# Patient Record
Sex: Male | Born: 1955
Health system: Southern US, Community
[De-identification: ages and names within clinical notes are randomized; demographics above are authoritative.]

## PROBLEM LIST (undated history)

## (undated) DIAGNOSIS — G43909 Migraine, unspecified, not intractable, without status migrainosus: Secondary | ICD-10-CM

## (undated) DIAGNOSIS — H539 Unspecified visual disturbance: Secondary | ICD-10-CM

## (undated) DIAGNOSIS — G35 Multiple sclerosis: Secondary | ICD-10-CM

## (undated) DIAGNOSIS — I10 Essential (primary) hypertension: Secondary | ICD-10-CM

## (undated) DIAGNOSIS — N200 Calculus of kidney: Secondary | ICD-10-CM

## (undated) HISTORY — PX: BACK SURGERY: SHX140

## (undated) HISTORY — DX: Unspecified visual disturbance: H53.9

## (undated) HISTORY — PX: FOOT SURGERY: SHX648

## (undated) HISTORY — DX: Calculus of kidney: N20.0

---

## 1999-01-17 ENCOUNTER — Encounter: Payer: Self-pay | Admitting: Specialist

## 1999-01-17 ENCOUNTER — Ambulatory Visit (HOSPITAL_COMMUNITY): Admission: RE | Admit: 1999-01-17 | Discharge: 1999-01-17 | Payer: Self-pay | Admitting: Specialist

## 1999-04-20 ENCOUNTER — Ambulatory Visit (HOSPITAL_COMMUNITY): Admission: RE | Admit: 1999-04-20 | Discharge: 1999-04-20 | Payer: Self-pay | Admitting: Neurology

## 1999-04-26 ENCOUNTER — Ambulatory Visit: Admission: RE | Admit: 1999-04-26 | Discharge: 1999-04-26 | Payer: Self-pay | Admitting: Anesthesiology

## 2000-04-11 ENCOUNTER — Ambulatory Visit (HOSPITAL_COMMUNITY): Admission: RE | Admit: 2000-04-11 | Discharge: 2000-04-11 | Payer: Self-pay | Admitting: Neurology

## 2000-04-11 ENCOUNTER — Encounter: Payer: Self-pay | Admitting: Neurology

## 2000-05-27 ENCOUNTER — Encounter: Payer: Self-pay | Admitting: Neurology

## 2000-05-27 ENCOUNTER — Ambulatory Visit (HOSPITAL_COMMUNITY): Admission: RE | Admit: 2000-05-27 | Discharge: 2000-05-27 | Payer: Self-pay | Admitting: Neurology

## 2000-08-21 ENCOUNTER — Encounter: Payer: Self-pay | Admitting: Psychiatry

## 2000-08-21 ENCOUNTER — Ambulatory Visit (HOSPITAL_COMMUNITY): Admission: RE | Admit: 2000-08-21 | Discharge: 2000-08-21 | Payer: Self-pay | Admitting: Psychiatry

## 2001-01-02 ENCOUNTER — Encounter: Payer: Self-pay | Admitting: *Deleted

## 2001-01-02 ENCOUNTER — Encounter: Admission: RE | Admit: 2001-01-02 | Discharge: 2001-01-02 | Payer: Self-pay | Admitting: *Deleted

## 2001-01-09 ENCOUNTER — Ambulatory Visit (HOSPITAL_COMMUNITY): Admission: RE | Admit: 2001-01-09 | Discharge: 2001-01-09 | Payer: Self-pay | Admitting: *Deleted

## 2002-03-03 ENCOUNTER — Encounter: Payer: Self-pay | Admitting: *Deleted

## 2002-03-03 ENCOUNTER — Ambulatory Visit (HOSPITAL_COMMUNITY): Admission: RE | Admit: 2002-03-03 | Discharge: 2002-03-03 | Payer: Self-pay | Admitting: Nephrology

## 2003-03-02 ENCOUNTER — Encounter: Payer: Self-pay | Admitting: Specialist

## 2003-03-03 ENCOUNTER — Ambulatory Visit (HOSPITAL_COMMUNITY): Admission: RE | Admit: 2003-03-03 | Discharge: 2003-03-04 | Payer: Self-pay | Admitting: Specialist

## 2003-03-03 ENCOUNTER — Encounter: Payer: Self-pay | Admitting: Specialist

## 2004-01-28 ENCOUNTER — Ambulatory Visit (HOSPITAL_COMMUNITY): Admission: RE | Admit: 2004-01-28 | Discharge: 2004-01-28 | Payer: Self-pay | Admitting: Psychiatry

## 2004-02-01 ENCOUNTER — Ambulatory Visit (HOSPITAL_COMMUNITY): Admission: RE | Admit: 2004-02-01 | Discharge: 2004-02-01 | Payer: Self-pay | Admitting: Psychiatry

## 2005-01-01 ENCOUNTER — Ambulatory Visit: Payer: Self-pay | Admitting: Internal Medicine

## 2005-01-09 ENCOUNTER — Encounter (HOSPITAL_COMMUNITY): Admission: RE | Admit: 2005-01-09 | Discharge: 2005-02-08 | Payer: Self-pay | Admitting: Specialist

## 2005-01-17 ENCOUNTER — Ambulatory Visit (HOSPITAL_COMMUNITY): Admission: RE | Admit: 2005-01-17 | Discharge: 2005-01-17 | Payer: Self-pay | Admitting: Internal Medicine

## 2005-01-17 ENCOUNTER — Ambulatory Visit: Payer: Self-pay | Admitting: Internal Medicine

## 2005-01-19 ENCOUNTER — Ambulatory Visit (HOSPITAL_COMMUNITY): Admission: RE | Admit: 2005-01-19 | Discharge: 2005-01-19 | Payer: Self-pay | Admitting: Internal Medicine

## 2009-12-21 ENCOUNTER — Emergency Department (HOSPITAL_COMMUNITY): Admission: EM | Admit: 2009-12-21 | Discharge: 2009-12-21 | Payer: Self-pay | Admitting: Emergency Medicine

## 2010-07-14 ENCOUNTER — Emergency Department (HOSPITAL_COMMUNITY): Admission: EM | Admit: 2010-07-14 | Discharge: 2010-07-14 | Payer: Self-pay | Admitting: Emergency Medicine

## 2010-09-05 ENCOUNTER — Encounter
Admission: RE | Admit: 2010-09-05 | Discharge: 2010-09-05 | Payer: Self-pay | Source: Home / Self Care | Attending: Psychiatry | Admitting: Psychiatry

## 2010-09-10 ENCOUNTER — Encounter: Payer: Self-pay | Admitting: Family Medicine

## 2010-11-07 LAB — ROCKY MTN SPOTTED FVR AB, IGM-BLOOD: RMSF IgM: 0.1 IV (ref 0.00–0.89)

## 2011-01-05 NOTE — Op Note (Signed)
NAME:  Paul Pope, Paul Pope                 ACCOUNT NO.:  [id number]   MEDICAL RECORD NO.:  [id number]          PATIENT TYPE:  AMB   LOCATION:  DAY                           FACILITY:  [facility]   PHYSICIAN:  Lionel December, M.D.    DATE OF BIRTH:  [birth year]   DATE OF PROCEDURE:  01/17/2005  DATE OF DISCHARGE:                                 OPERATIVE REPORT   PROCEDURE:  Esophagogastroduodenoscopy with esophageal dilation.   INDICATIONS:  Boris is a 55 year old Caucasian male with chronic GERD as  well as intermittent solid food dysphagia.  He has had heartburn for at  least five years.  He is presently using Nexium on a p.r.n. basis.  He is  undergoing diagnostic and possibly therapeutic EGD.  Procedures were  reviewed with the patient and informed consent was obtained.   PREMEDICATION:  Cetacaine spray for pharyngeal topical anesthesia, Demerol  50 mg IV, Versed 8 mg IV in divided doses.   FINDINGS:  The procedures were performed in the endoscopy suite.  The  patient's vital signs and O2 saturation were monitored during the procedure  and remained stable.   The patient was placed in the left lateral recumbent position and Olympus  videoscope was passed through the oropharynx without any difficulty into the  esophagus.   Esophagus:  Mucosa of the esophagus was normal throughout.  The GE junction  was at 40 cm from the incisors.   Stomach:  It was empty and distended very well on insufflation.  Folds in  the proximal stomach were normal.  Examination of the mucosa revealed  prepyloric erosion and antral erythema.  Pyloric channel was patent.  Angularis, fundus and cardia were examined by retroflexing the scope and  were normal.   Duodenum:  Examination of the bulb revealed normal mucosa.  Scope was passed  to the second part of the duodenum where mucosa and folds were normal.   The endoscope was withdrawn.  The patient tolerated the procedure well.   The esophageal was dilated by  passing 56-French Maloney dilator to full  insertion.  As the dilator was withdrawn, endoscope was passed again and no  mucosal injury noted in the esophagus.   Endoscope was withdrawn.  The patient tolerated the procedure well.   FINAL DIAGNOSIS:  1.  Erosive antral gastritis; otherwise normal esophagogastroduodenoscopy.  2.  Esophagus dilated by passing 56-French Maloney dilator given history of      solid food dysphagia with no mucosal disruption noted which may indicate      an underlying esophageal motility disorder.   RECOMMENDATIONS:  1.  Antireflux measures, Nexium.  Note the patient is on as before.  2.  He is on baclofen which may also be helping with this.  3.  Helicobacter pylori serology will be checked today.  4.  If he remains with dysphagia, will consider barium swallow with pill.  5.  Upper abdominal ultrasound to further evaluate his intermittent right      upper quadrant pain.       NR/MEDQ  D:  01/17/2005  T:  01/17/2005  Job:  [id number]   cc:   Holley Bouche, M.D.  [address]  [place], [place] [address]  Fax: [phone]

## 2011-01-05 NOTE — Op Note (Signed)
NAME:  QUENTON, RECENDEZ                           ACCOUNT NO.:  [id number]   MEDICAL RECORD NO.:  [id number]                   PATIENT TYPE:  OIB   LOCATION:  NA                                   FACILITY:  [facility]   PHYSICIAN:  Kerrin Champagne, M.D.                DATE OF BIRTH:  June 25, 1956   DATE OF PROCEDURE:  03/03/2003  DATE OF DISCHARGE:                                 OPERATIVE REPORT   PREOPERATIVE DIAGNOSIS:  Right L5-S1 large disk herniation with extruded  fragment causing right S1 radiculopathy.   POSTOPERATIVE DIAGNOSIS:  Right L5-S1 large disk herniation with extruded  fragment causing right S1 radiculopathy.   OPERATION PERFORMED:  Right-sided microdiskectomy, L5-S1, excision of large  disk herniation.   SURGEON:  Kerrin Champagne, M.D.   ASSISTANT:  Wende Neighbors, P.A.   ANESTHESIA:  General orotracheal anesthesia.   ANESTHESIOLOGIST:  Bedelia Person, M.D.   ESTIMATED BLOOD LOSS:  25mL.   DRAINS:  None.   INDICATIONS FOR PROCEDURE:  The patient is a 55 year old male who has been  experiencing pain into his back and radiation into the right leg.  This has  been present for a period of at least three weeks.  Pain is severe.  It is  uncontrolled with the use of narcotic medication. He has difficulty  standing, sitting, walking, difficulty staying in one place.  He is unable  to sleep and basically has recalcitrant pain due to this protrusion.  MRI  scan shows a very large disk herniation L5-S1 on the right side with  extruded fragment.  Sciatic tension signs are positive x3.   INTRAOPERATIVE FINDINGS:  A large disk herniation right L5-S1 with two large  extruded fragments.   DESCRIPTION OF PROCEDURE:  After adequate general anesthesia, the patient in  knee-chest position, Andrews frame, standard preoperative antibiotics,  standard prep with DuraPrep solution, draped in the usual manner, iodine Vi-  drape.  The incision at the expected L5-S1 level as determined by  palpation  of iliac crest and spinous process in the midline.  Approximately an inch to  an inch and a half in length.  Through the skin and subcu layers down to the  spinous process of L5 and S1.  Cobb used to elevate the soft tissue over the  spinous process and electrocautery used to divide the lumbodorsal attachment  along the right side at L5 and S1.  Kocher clamp placed on the spinous  process of L5 and intraoperative lateral radiograph demonstrated the Kocher  clamp on the L5 spinous process.  Cobbs then used to elevate the paralumbar  muscles on the right side exposing the posterior aspect of the interlaminar  space at the L5-S1 level.  McCullough retractor inserted.  Leksell rongeur  used to remove the small portion of the inferior aspect of the lamina on the  right side at L5.  A  3 mm Kerrison then used to carefully free the patient's  ligamentum flavum attachment to the superior aspect of the S1 lamina  releasing this along its superior aspect of the S1 lamina and then over the  medial aspect of the L5-S1 facet  then up superiorly.  The medial attachment  of the ligamentum flavum left in place.  The foraminotomy performed over the  right S1 nerve root.  The operating room microscope draped and brought into  the field.  Initial exposure obtained using loupe magnification as well as  head lamp. Under the operating microscope then the S1 nerve root was  identified.  This was left in place. The thecal sac then retracted medially,  slightly.  This material was found to be extruding along the lateral aspect  of the S1 nerve root between this area and the S1 pedicle and the inferior  aspect of the facet of L5-S1.  A micropituitary was used to grasp the free  fragments within the spinal canal and remove two very large fragments  measuring approximately 2.5 to 3 cm by 2 cm by 1.5 cm.  These appeared to be  the major portion of the extruded fragment and mass effect on the thecal sac  and  S1 nerve root.  Small bleeders were controlled using bipolar  electrocautery.  These were epidural veins.  The S1 nerve root probed as it  exited out the S1 neural foramen both anterior and posterior to the nerve  root demonstrating no further nerve compression.   The L5-S1 disk space identified using the microscope and then probed.  A  collapse in the disk was noted centrally located where the fragments then  extruded from the disk protrusion.  Pituitary rongeur was able to be entered  into this defect and used to probe the disk.  No further fragments were  noted to be present within the disk, able to be extruded.  Irrigation was  then performed. Bone wax applied to bleeding cancellous bone surfaces, then  excess bone was removed.  Careful inspection demonstrated no active bleeding  evident.  Hockey stick nerve probe could be passed out the L5 and S1 neural  foramen without difficulty and over the posterior aspect of the disk space  of L5-S1 and over the posterior aspect of S1 vertebral body without any sign  of further mass effect here. The S1 nerve root was freely mobile within the  laminotomy area.  Soft tissue was then allowed to fall back into place.  The  lumbodorsal fascia approximated with interrupted 0 Vicryl suture using a 0  Vicryl on a UR6 needle.  The subcutaneous layers were reapproximated with  interrupted 0 Vicryl sutures, more superficial layers with interrupted 2-0  Vicryl suture and skin closed with a running subcutaneous stitch of 4-0  Vicryl.  Tincture of Benzoin and Steri-Strips applied.  The patient then  returned to a supine position, extubated and returned to recovery room in  satisfactory condition.  Sponge and instrument counts were correct.                                                Kerrin Champagne, M.D.    JEN/MEDQ  D:  03/03/2003  T:  03/03/2003  Job:  [id number]

## 2011-09-10 DIAGNOSIS — G35 Multiple sclerosis: Secondary | ICD-10-CM | POA: Diagnosis not present

## 2011-09-17 DIAGNOSIS — G890 Central pain syndrome: Secondary | ICD-10-CM | POA: Diagnosis not present

## 2011-09-17 DIAGNOSIS — M5137 Other intervertebral disc degeneration, lumbosacral region: Secondary | ICD-10-CM | POA: Diagnosis not present

## 2011-10-15 DIAGNOSIS — M961 Postlaminectomy syndrome, not elsewhere classified: Secondary | ICD-10-CM | POA: Diagnosis not present

## 2011-10-15 DIAGNOSIS — M5137 Other intervertebral disc degeneration, lumbosacral region: Secondary | ICD-10-CM | POA: Diagnosis not present

## 2011-10-15 DIAGNOSIS — G890 Central pain syndrome: Secondary | ICD-10-CM | POA: Diagnosis not present

## 2011-11-13 DIAGNOSIS — M5137 Other intervertebral disc degeneration, lumbosacral region: Secondary | ICD-10-CM | POA: Diagnosis not present

## 2011-11-13 DIAGNOSIS — G890 Central pain syndrome: Secondary | ICD-10-CM | POA: Diagnosis not present

## 2011-11-13 DIAGNOSIS — G35 Multiple sclerosis: Secondary | ICD-10-CM | POA: Diagnosis not present

## 2011-12-11 DIAGNOSIS — M5137 Other intervertebral disc degeneration, lumbosacral region: Secondary | ICD-10-CM | POA: Diagnosis not present

## 2011-12-11 DIAGNOSIS — G35 Multiple sclerosis: Secondary | ICD-10-CM | POA: Diagnosis not present

## 2011-12-11 DIAGNOSIS — IMO0002 Reserved for concepts with insufficient information to code with codable children: Secondary | ICD-10-CM | POA: Diagnosis not present

## 2011-12-11 DIAGNOSIS — M961 Postlaminectomy syndrome, not elsewhere classified: Secondary | ICD-10-CM | POA: Diagnosis not present

## 2012-01-08 DIAGNOSIS — G890 Central pain syndrome: Secondary | ICD-10-CM | POA: Diagnosis not present

## 2012-01-08 DIAGNOSIS — G35 Multiple sclerosis: Secondary | ICD-10-CM | POA: Diagnosis not present

## 2012-01-08 DIAGNOSIS — M5137 Other intervertebral disc degeneration, lumbosacral region: Secondary | ICD-10-CM | POA: Diagnosis not present

## 2012-01-08 DIAGNOSIS — M961 Postlaminectomy syndrome, not elsewhere classified: Secondary | ICD-10-CM | POA: Diagnosis not present

## 2012-02-05 DIAGNOSIS — M961 Postlaminectomy syndrome, not elsewhere classified: Secondary | ICD-10-CM | POA: Diagnosis not present

## 2012-02-05 DIAGNOSIS — M5137 Other intervertebral disc degeneration, lumbosacral region: Secondary | ICD-10-CM | POA: Diagnosis not present

## 2012-02-05 DIAGNOSIS — G890 Central pain syndrome: Secondary | ICD-10-CM | POA: Diagnosis not present

## 2012-03-04 DIAGNOSIS — G890 Central pain syndrome: Secondary | ICD-10-CM | POA: Diagnosis not present

## 2012-03-04 DIAGNOSIS — M5137 Other intervertebral disc degeneration, lumbosacral region: Secondary | ICD-10-CM | POA: Diagnosis not present

## 2012-03-10 DIAGNOSIS — G35 Multiple sclerosis: Secondary | ICD-10-CM | POA: Diagnosis not present

## 2012-04-14 DIAGNOSIS — L719 Rosacea, unspecified: Secondary | ICD-10-CM | POA: Diagnosis not present

## 2012-04-14 DIAGNOSIS — L408 Other psoriasis: Secondary | ICD-10-CM | POA: Diagnosis not present

## 2012-04-14 DIAGNOSIS — L570 Actinic keratosis: Secondary | ICD-10-CM | POA: Diagnosis not present

## 2012-04-15 DIAGNOSIS — G890 Central pain syndrome: Secondary | ICD-10-CM | POA: Diagnosis not present

## 2012-04-15 DIAGNOSIS — M5137 Other intervertebral disc degeneration, lumbosacral region: Secondary | ICD-10-CM | POA: Diagnosis not present

## 2012-04-15 DIAGNOSIS — IMO0001 Reserved for inherently not codable concepts without codable children: Secondary | ICD-10-CM | POA: Diagnosis not present

## 2012-05-27 DIAGNOSIS — IMO0001 Reserved for inherently not codable concepts without codable children: Secondary | ICD-10-CM | POA: Diagnosis not present

## 2012-05-27 DIAGNOSIS — M5137 Other intervertebral disc degeneration, lumbosacral region: Secondary | ICD-10-CM | POA: Diagnosis not present

## 2012-05-27 DIAGNOSIS — G890 Central pain syndrome: Secondary | ICD-10-CM | POA: Diagnosis not present

## 2012-05-27 DIAGNOSIS — G35 Multiple sclerosis: Secondary | ICD-10-CM | POA: Diagnosis not present

## 2012-07-22 DIAGNOSIS — G890 Central pain syndrome: Secondary | ICD-10-CM | POA: Diagnosis not present

## 2012-07-22 DIAGNOSIS — M5137 Other intervertebral disc degeneration, lumbosacral region: Secondary | ICD-10-CM | POA: Diagnosis not present

## 2012-08-19 DIAGNOSIS — M961 Postlaminectomy syndrome, not elsewhere classified: Secondary | ICD-10-CM | POA: Diagnosis not present

## 2012-08-19 DIAGNOSIS — G35 Multiple sclerosis: Secondary | ICD-10-CM | POA: Diagnosis not present

## 2012-08-19 DIAGNOSIS — M5137 Other intervertebral disc degeneration, lumbosacral region: Secondary | ICD-10-CM | POA: Diagnosis not present

## 2012-08-19 DIAGNOSIS — G988 Other disorders of nervous system: Secondary | ICD-10-CM | POA: Diagnosis not present

## 2012-09-15 DIAGNOSIS — G959 Disease of spinal cord, unspecified: Secondary | ICD-10-CM | POA: Diagnosis not present

## 2012-09-15 DIAGNOSIS — G35 Multiple sclerosis: Secondary | ICD-10-CM | POA: Diagnosis not present

## 2012-10-27 DIAGNOSIS — G890 Central pain syndrome: Secondary | ICD-10-CM | POA: Diagnosis not present

## 2012-10-27 DIAGNOSIS — M5137 Other intervertebral disc degeneration, lumbosacral region: Secondary | ICD-10-CM | POA: Diagnosis not present

## 2012-12-04 DIAGNOSIS — G890 Central pain syndrome: Secondary | ICD-10-CM | POA: Diagnosis not present

## 2012-12-04 DIAGNOSIS — M5137 Other intervertebral disc degeneration, lumbosacral region: Secondary | ICD-10-CM | POA: Diagnosis not present

## 2012-12-04 DIAGNOSIS — M961 Postlaminectomy syndrome, not elsewhere classified: Secondary | ICD-10-CM | POA: Diagnosis not present

## 2012-12-12 ENCOUNTER — Telehealth: Payer: Self-pay | Admitting: *Deleted

## 2012-12-12 NOTE — Telephone Encounter (Signed)
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

## 2013-01-22 DIAGNOSIS — F4322 Adjustment disorder with anxiety: Secondary | ICD-10-CM | POA: Diagnosis not present

## 2013-01-22 DIAGNOSIS — M5137 Other intervertebral disc degeneration, lumbosacral region: Secondary | ICD-10-CM | POA: Diagnosis not present

## 2013-01-22 DIAGNOSIS — G890 Central pain syndrome: Secondary | ICD-10-CM | POA: Diagnosis not present

## 2013-02-12 DIAGNOSIS — G890 Central pain syndrome: Secondary | ICD-10-CM | POA: Diagnosis not present

## 2013-02-12 DIAGNOSIS — M5137 Other intervertebral disc degeneration, lumbosacral region: Secondary | ICD-10-CM | POA: Diagnosis not present

## 2013-04-09 DIAGNOSIS — M5137 Other intervertebral disc degeneration, lumbosacral region: Secondary | ICD-10-CM | POA: Diagnosis not present

## 2013-04-09 DIAGNOSIS — G890 Central pain syndrome: Secondary | ICD-10-CM | POA: Diagnosis not present

## 2013-04-09 DIAGNOSIS — G35 Multiple sclerosis: Secondary | ICD-10-CM | POA: Diagnosis not present

## 2013-04-09 DIAGNOSIS — Z79899 Other long term (current) drug therapy: Secondary | ICD-10-CM | POA: Diagnosis not present

## 2013-04-14 DIAGNOSIS — L0100 Impetigo, unspecified: Secondary | ICD-10-CM | POA: Diagnosis not present

## 2013-04-14 DIAGNOSIS — L570 Actinic keratosis: Secondary | ICD-10-CM | POA: Diagnosis not present

## 2013-05-05 DIAGNOSIS — G890 Central pain syndrome: Secondary | ICD-10-CM | POA: Diagnosis not present

## 2013-05-05 DIAGNOSIS — Z79899 Other long term (current) drug therapy: Secondary | ICD-10-CM | POA: Diagnosis not present

## 2013-05-05 DIAGNOSIS — IMO0002 Reserved for concepts with insufficient information to code with codable children: Secondary | ICD-10-CM | POA: Diagnosis not present

## 2013-06-02 DIAGNOSIS — Z79899 Other long term (current) drug therapy: Secondary | ICD-10-CM | POA: Diagnosis not present

## 2013-06-02 DIAGNOSIS — G35 Multiple sclerosis: Secondary | ICD-10-CM | POA: Diagnosis not present

## 2013-06-02 DIAGNOSIS — M5137 Other intervertebral disc degeneration, lumbosacral region: Secondary | ICD-10-CM | POA: Diagnosis not present

## 2013-06-02 DIAGNOSIS — G890 Central pain syndrome: Secondary | ICD-10-CM | POA: Diagnosis not present

## 2013-06-05 ENCOUNTER — Ambulatory Visit: Payer: Self-pay | Admitting: Cardiology

## 2013-06-05 DIAGNOSIS — G35 Multiple sclerosis: Secondary | ICD-10-CM | POA: Diagnosis not present

## 2013-06-11 ENCOUNTER — Encounter: Payer: Self-pay | Admitting: *Deleted

## 2013-06-11 DIAGNOSIS — E782 Mixed hyperlipidemia: Secondary | ICD-10-CM | POA: Insufficient documentation

## 2013-06-11 DIAGNOSIS — K219 Gastro-esophageal reflux disease without esophagitis: Secondary | ICD-10-CM

## 2013-06-11 DIAGNOSIS — R030 Elevated blood-pressure reading, without diagnosis of hypertension: Secondary | ICD-10-CM | POA: Insufficient documentation

## 2013-06-12 ENCOUNTER — Encounter: Payer: Self-pay | Admitting: Cardiovascular Disease

## 2013-06-12 ENCOUNTER — Encounter: Payer: Self-pay | Admitting: Cardiology

## 2013-06-12 ENCOUNTER — Encounter: Payer: Self-pay | Admitting: *Deleted

## 2013-06-12 ENCOUNTER — Ambulatory Visit (INDEPENDENT_AMBULATORY_CARE_PROVIDER_SITE_OTHER): Payer: Medicare Other | Admitting: Cardiovascular Disease

## 2013-06-12 VITALS — BP 129/82 | HR 75 | Ht 71.0 in | Wt 215.0 lb

## 2013-06-12 DIAGNOSIS — R030 Elevated blood-pressure reading, without diagnosis of hypertension: Secondary | ICD-10-CM

## 2013-06-12 DIAGNOSIS — R079 Chest pain, unspecified: Secondary | ICD-10-CM | POA: Diagnosis not present

## 2013-06-12 DIAGNOSIS — E782 Mixed hyperlipidemia: Secondary | ICD-10-CM | POA: Diagnosis not present

## 2013-06-12 NOTE — Patient Instructions (Addendum)
Your physician recommends that you schedule a follow-up appointment in: 4-6 weeks with Dr Purvis Sheffield  Your physician has requested that you have a stress echocardiogram. For further information please visit [email or web address] Please follow instruction sheet as given.  Exercise Stress Echocardiography A stress echocardiogram, sometimes called an exercise or stress echo, is a test that combines a treadmill stress test and an echocardiogram. Sometimes a treadmill test alone gives uncertain results. The addition of an echocardiogram can improve accuracy in diagnosing heart disease. A stress echo helps your caregiver know if you need more tests on your heart or further treatment. A stress echo is done to:  Look for blocked arteries in your heart.  Detect irregular heart rhythms.  Determine how hard you are able to exercise before your heart gets into trouble or develops problems.  Show how fast your heart recovers after exercise.  If for some reason you are unable to exercise, this test can also be done with the use of medications which can speed up and stress your heart. BEFORE THE PROCEDURE  You may bathe or shower the morning of the test. Do not apply body lotions or powder to your chest.  You should be present 15 minutes prior to your test or as instructed by your caregiver.  Wear comfortable clothes and good walking or running shoes.  Follow your caregiver's instructions regarding eating and drinking before the test.  Do not smoke for 4 hours prior to the test.  Take your medications as directed at regular times with water unless instructed otherwise. If you are on insulin, ask how you are to take it and if there are special instructions. It is common to adjust insulin dosing the morning of the examination.  Beta blocker medications interfere with the test. If you are on a beta blocker, do not take this medication for 72 hours before the test unless your caregiver instructs you  otherwise. Be sure to ask if you do not understand. This must be discussed with your caregiver prior to your appointment.  If you wear a nitroglycerin patch, it may need to be removed prior to the test. Ask your caregiver if the patch should be removed. PROCEDURE   An echocardiogram will be done before exercise and again at peak heart rate. The echocardiogram uses sound waves (ultrasound) to provide an image of the heart's internal structures, size and movement. This image is produced by moving a transducer (a very sensitive wand-like device) over the chest area. A jelly like substance is put on your chest to improve the contact between the wand and your chest. Images of the beating heart are made by bouncing high-frequency (ultrasound) sound waves off the heart.  Electrodes placed on the chest will monitor the heart's rate and rhythm throughout the test. If you have a hairy chest, small areas may have to be shaved to get better contact with the electrodes. Once the electrodes are attached to your body, multiple wires will be attached to the electrodes. These are then attached to an (EKG) machine. The EKG will monitor your heart's rate and rhythm (regularity).  Your caregiver (often a cardiologist) will have you walk on a treadmill. The treadmill will gradually be made to increase in speed and incline. You will exercise up to 15 minutes depending upon your condition and the condition of your heart.  The test will be stopped if you become too tired. It will also be stopped if you develop chest pain, blood pressure problems, abnormal heart rhythms or  signs on the EKG that suggest the heart is overly stressed and not getting enough blood.  At the peak of exercise, the treadmill will be stopped. You will lie down immediately on a bed so that a second echocardiogram can be taken to visualize the heart's motion with exercise.  The test usually takes 30-60 minutes to complete. HOME CARE INSTRUCTIONS    Resume your pre-hospital medications, activity and diet unless instructed otherwise.  Keep your follow-up appointments as instructed. SEEK IMMEDIATE MEDICAL CARE IF:  You have pain or pressure in the following areas:  Chest.  Jaw or neck.  Between your shoulder blades.  Pain radiating down your left arm.  You have nausea (feeling sick to your stomach).  You vomit.  You faint.  You feel short of breath. MAKE SURE YOU:   Understand these instructions.  Will watch your condition.  Will get help right away if you are not doing well or get worse. Document Released: 08/10/2004 Document Revised: 10/29/2011 Document Reviewed: 11/16/2008 [facility] Patient Information 2014 [place], [place].

## 2013-06-12 NOTE — Progress Notes (Signed)
Patient ID: Paul Pope, male   DOB: April 22, 1956, 57 y.o.   MRN: [id number]       CARDIOLOGY CONSULT NOTE  Patient ID: Paul Pope MRN: [id number] DOB/AGE: 01-22-1956 57 y.o.  Admit date: (Not on file) Primary Physician: Catalina Pizza, MD Reason for Consultation: chest pain  HPI: Paul Pope is a 57 yr old male with a h/o multiple sclerosis who is referred for the evaluation of chest pain.  He says he has a h/o chest muscle spasms lasting several minutes to hours, which date back 20 years. However, since April/May, he has been experiencing brief, episodic episodes of substernal chest pain lasting seconds. It is not associated with shortness of breath, diaphoresis, dizziness, palpitations, or leg swelling. They occur twice a week at the most. Yesterday, he felt weak and tired for about an hour, and these episodes started in April/May as well.   He tries to stay active, and walks 20 minutes on most days of the week. He has made dietary changes to help control elevated lipids/triglycerides.  On 05/04/2013, lipid panel showed TC 184, TG 152, HDL 40, and LDL 114.  He takes oxycodone and tramadol for pain, and says that the oxycodone gives him more energy, thus helping him to stay active.  SocHx: nonsmoker  FamHx: mother had CABG at age 20.    Allergies  Allergen Reactions  . Sulfa Antibiotics     Current Outpatient Prescriptions  Medication Sig Dispense Refill  . baclofen (LIORESAL) 20 MG tablet Take 20 mg by mouth 2 (two) times daily.       . Fingolimod HCl (GILENYA) 0.5 MG CAPS Take 1 capsule by mouth daily.      Marland Kitchen gabapentin (NEURONTIN) 300 MG capsule Take 300 mg by mouth 4 (four) times daily.       Marland Kitchen oxyCODONE-acetaminophen (PERCOCET) 7.5-325 MG per tablet Take 1 tablet by mouth every 6 (six) hours as needed.       . polyethylene glycol powder (GLYCOLAX/MIRALAX) powder       . traMADol (ULTRAM) 50 MG tablet Take 50 mg by mouth every 6 (six) hours as needed.        No current  facility-administered medications for this visit.    No past medical history on file.  No past surgical history on file.  History   Social History  . Marital Status: Married    Spouse Name: N/A    Number of Children: N/A  . Years of Education: N/A   Occupational History  . Not on file.   Social History Main Topics  . Smoking status: Never Smoker   . Smokeless tobacco: Not on file  . Alcohol Use: Not on file  . Drug Use: Not on file  . Sexual Activity: Not on file   Other Topics Concern  . Not on file   Social History Narrative  . No narrative on file     No family history on file.   Prior to Admission medications   Medication Sig Start Date End Date Taking? Authorizing Provider  baclofen (LIORESAL) 20 MG tablet Take 20 mg by mouth 2 (two) times daily.  04/30/13  Yes Historical Provider, MD  Fingolimod HCl (GILENYA) 0.5 MG CAPS Take 1 capsule by mouth daily.   Yes Historical Provider, MD  gabapentin (NEURONTIN) 300 MG capsule Take 300 mg by mouth 4 (four) times daily.  06/09/13  Yes Historical Provider, MD  oxyCODONE-acetaminophen (PERCOCET) 7.5-325 MG per tablet Take 1 tablet by mouth every  6 (six) hours as needed.  06/08/13  Yes Historical Provider, MD  polyethylene glycol powder (GLYCOLAX/MIRALAX) powder  06/01/13  Yes Historical Provider, MD  traMADol (ULTRAM) 50 MG tablet Take 50 mg by mouth every 6 (six) hours as needed.  06/10/13  Yes Historical Provider, MD     Review of systems complete and found to be negative unless listed above in HPI     Physical exam Blood pressure 129/82, pulse 75, height 5\' 11"  (1.803 m), weight 215 lb (97.523 kg). General: NAD Neck: No JVD, no thyromegaly or thyroid nodule.  Lungs: Clear to auscultation bilaterally with normal respiratory effort. CV: Nondisplaced PMI.  Heart regular S1/S2, no S3/S4, no murmur.  No peripheral edema.  No carotid bruit.  Normal pedal pulses.  Abdomen: Soft, nontender, no hepatosplenomegaly, no  distention.  Skin: Intact without lesions or rashes.  Neurologic: Alert and oriented x 3.  Psych: Normal affect. Extremities: No clubbing or cyanosis.  HEENT: Normal.   Labs:   No results found for this basename: WBC, HGB, HCT, MCV, PLT   No results found for this basename: NA, K, CL, CO2, BUN, CREATININE, CALCIUM, LABALBU, PROT, BILITOT, ALKPHOS, ALT, AST, GLUCOSE,  in the last 168 hours No results found for this basename: CKTOTAL, CKMB, CKMBINDEX, TROPONINI    No results found for this basename: CHOL   No results found for this basename: HDL   No results found for this basename: LDLCALC   No results found for this basename: TRIG   No results found for this basename: CHOLHDL   No results found for this basename: LDLDIRECT       EKG: NSR, 76 bpm, nonspecific T wave abnormality   Studies: No results found.  ASSESSMENT AND PLAN:  1. Chest pain: his symptoms are somewhat atypical in that they do not occur with exertion, and only last seconds, with no associated symptoms typical for ischemic heart disease such as dyspnea, diaphoresis, or palpitations. He does have episodic weakness and does have a longstanding h/o chest wall muscle spasms, which are likely secondary to his multiple sclerosis. His lipids are under good control with diet. With his mother having CABG at 61, there does not appear to be a family h/o premature CAD. In order to evaluate for ischemic heart disease, will proceed with a stress echocardiogram and have him f/u with me shortly thereafter.     Signed: Prentice Docker, M.D., F.A.C.C.  06/12/2013, 10:06 AM

## 2013-06-25 ENCOUNTER — Encounter (HOSPITAL_COMMUNITY): Payer: Self-pay

## 2013-06-25 ENCOUNTER — Ambulatory Visit (HOSPITAL_COMMUNITY)
Admission: RE | Admit: 2013-06-25 | Discharge: 2013-06-25 | Disposition: A | Discharge status: Home / Self Care | Payer: BC Managed Care – PPO | Source: Ambulatory Visit | Attending: Cardiovascular Disease | Admitting: Cardiovascular Disease

## 2013-06-25 DIAGNOSIS — G35 Multiple sclerosis: Secondary | ICD-10-CM | POA: Diagnosis not present

## 2013-06-25 DIAGNOSIS — I10 Essential (primary) hypertension: Secondary | ICD-10-CM | POA: Diagnosis not present

## 2013-06-25 DIAGNOSIS — R079 Chest pain, unspecified: Secondary | ICD-10-CM | POA: Diagnosis not present

## 2013-06-25 DIAGNOSIS — E785 Hyperlipidemia, unspecified: Secondary | ICD-10-CM | POA: Insufficient documentation

## 2013-06-25 DIAGNOSIS — R030 Elevated blood-pressure reading, without diagnosis of hypertension: Secondary | ICD-10-CM

## 2013-06-25 DIAGNOSIS — R072 Precordial pain: Secondary | ICD-10-CM | POA: Diagnosis not present

## 2013-06-25 NOTE — Progress Notes (Signed)
*  PRELIMINARY RESULTS* Echocardiogram Echocardiogram Stress Test has been performed.  Paul Pope 06/25/2013, 11:55 AM

## 2013-06-25 NOTE — Progress Notes (Signed)
Stress Lab Nurses Notes - Paul Pope  Paul Pope 06/25/2013 Reason for doing test: Chest Pain and HTN Type of test: Stress Echo Nurse performing test: Parke Poisson, RN Nuclear Medicine Tech: Not Applicable Echo Tech: Veda Canning MD performing test: Koneswaran/K.Lawrence NP Family MD: Margo Aye Test explained and consent signed: yes IV started: No IV started Symptoms: discomfort in legs Treatment/Intervention: None Reason test stopped: reached target HR After recovery IV was: NA Patient to return to Nuc. Med at : NA Patient discharged: Home Patient's Condition upon discharge was: stable Comments: During test peak BP 169/83 & HR 153 & BP 180/81 after coming off TM .  Recovery BP 141/88 & HR 80.  Symptoms resolved in recovery.   [name]

## 2013-06-30 DIAGNOSIS — M5137 Other intervertebral disc degeneration, lumbosacral region: Secondary | ICD-10-CM | POA: Diagnosis not present

## 2013-06-30 DIAGNOSIS — Z79899 Other long term (current) drug therapy: Secondary | ICD-10-CM | POA: Diagnosis not present

## 2013-06-30 DIAGNOSIS — G890 Central pain syndrome: Secondary | ICD-10-CM | POA: Diagnosis not present

## 2013-07-27 ENCOUNTER — Telehealth: Payer: Self-pay | Admitting: Cardiovascular Disease

## 2013-07-27 NOTE — Telephone Encounter (Signed)
Laqueta Linden, MD Ovidio Kin, LPN            The patient can f/u prn, based on his normal stress echo findings. I agree in that patients should routinely f/u, even after normal stress test results. In this particular case, it is ok for him not to do so.

## 2013-07-27 NOTE — Telephone Encounter (Signed)
Patient cancelled appointment.  States that his test was normal and wants to know if he really needs to come back. / tgs

## 2013-07-27 NOTE — Telephone Encounter (Signed)
Please advise if this pt needs to come back in for his f/u based on pt comment below? FYI: pt are told to come back in office for follow up apt to go over further details for future plans verbally, discussion given to nurses to document in discharge summary to keep f/u apts even if results are normal as follows  PLEASE BE ADVISED YOU WILL STILL NEED TO KEEP YOUR FOLLOW UP APPOINTMENT TO DISCUSS FURTHER DETAILS OF YOUR TEST RESULTS/NEXT STEPS/FUTURE PLAN OF CARE WITH YOUR PROVIDER DESPITE THE FACT THAT YOU MAY HAVE NORMAL TEST RESULTS

## 2013-07-30 ENCOUNTER — Ambulatory Visit: Payer: Medicare Other | Admitting: Cardiovascular Disease

## 2013-07-30 DIAGNOSIS — G988 Other disorders of nervous system: Secondary | ICD-10-CM | POA: Diagnosis not present

## 2013-07-30 DIAGNOSIS — Z79899 Other long term (current) drug therapy: Secondary | ICD-10-CM | POA: Diagnosis not present

## 2013-07-30 DIAGNOSIS — M5137 Other intervertebral disc degeneration, lumbosacral region: Secondary | ICD-10-CM | POA: Diagnosis not present

## 2013-07-30 DIAGNOSIS — G890 Central pain syndrome: Secondary | ICD-10-CM | POA: Diagnosis not present

## 2013-07-30 NOTE — Telephone Encounter (Signed)
.  left message to have patient return my call.  

## 2013-07-30 NOTE — Telephone Encounter (Signed)
Message copied by Ovidio Kin on Thu Jul 30, 2013  2:07 PM ------      Message from: Prentice Docker A      Created: Mon Jul 27, 2013 12:35 PM       The patient can f/u prn, based on his normal stress echo findings. I agree in that patients should routinely f/u, even after normal stress test results. In this particular case, it is ok for him not to do so. ------

## 2013-09-24 DIAGNOSIS — G35 Multiple sclerosis: Secondary | ICD-10-CM | POA: Diagnosis not present

## 2013-09-24 DIAGNOSIS — G890 Central pain syndrome: Secondary | ICD-10-CM | POA: Diagnosis not present

## 2013-09-24 DIAGNOSIS — M961 Postlaminectomy syndrome, not elsewhere classified: Secondary | ICD-10-CM | POA: Diagnosis not present

## 2013-09-24 DIAGNOSIS — F4322 Adjustment disorder with anxiety: Secondary | ICD-10-CM | POA: Diagnosis not present

## 2013-11-10 DIAGNOSIS — M67919 Unspecified disorder of synovium and tendon, unspecified shoulder: Secondary | ICD-10-CM | POA: Diagnosis not present

## 2013-11-10 DIAGNOSIS — M47812 Spondylosis without myelopathy or radiculopathy, cervical region: Secondary | ICD-10-CM | POA: Diagnosis not present

## 2013-11-10 DIAGNOSIS — M719 Bursopathy, unspecified: Secondary | ICD-10-CM | POA: Diagnosis not present

## 2013-11-18 DIAGNOSIS — M5137 Other intervertebral disc degeneration, lumbosacral region: Secondary | ICD-10-CM | POA: Diagnosis not present

## 2013-11-18 DIAGNOSIS — Z79899 Other long term (current) drug therapy: Secondary | ICD-10-CM | POA: Diagnosis not present

## 2013-11-18 DIAGNOSIS — G890 Central pain syndrome: Secondary | ICD-10-CM | POA: Diagnosis not present

## 2013-12-23 DIAGNOSIS — D7289 Other specified disorders of white blood cells: Secondary | ICD-10-CM | POA: Diagnosis not present

## 2013-12-23 DIAGNOSIS — G35 Multiple sclerosis: Secondary | ICD-10-CM | POA: Diagnosis not present

## 2014-01-26 DIAGNOSIS — IMO0001 Reserved for inherently not codable concepts without codable children: Secondary | ICD-10-CM | POA: Diagnosis not present

## 2014-01-26 DIAGNOSIS — G890 Central pain syndrome: Secondary | ICD-10-CM | POA: Diagnosis not present

## 2014-01-26 DIAGNOSIS — M961 Postlaminectomy syndrome, not elsewhere classified: Secondary | ICD-10-CM | POA: Diagnosis not present

## 2014-04-01 DIAGNOSIS — G890 Central pain syndrome: Secondary | ICD-10-CM | POA: Diagnosis not present

## 2014-04-01 DIAGNOSIS — M5137 Other intervertebral disc degeneration, lumbosacral region: Secondary | ICD-10-CM | POA: Diagnosis not present

## 2014-04-01 DIAGNOSIS — G894 Chronic pain syndrome: Secondary | ICD-10-CM | POA: Diagnosis not present

## 2014-05-10 DIAGNOSIS — M5137 Other intervertebral disc degeneration, lumbosacral region: Secondary | ICD-10-CM | POA: Diagnosis not present

## 2014-05-10 DIAGNOSIS — G894 Chronic pain syndrome: Secondary | ICD-10-CM | POA: Diagnosis not present

## 2014-05-10 DIAGNOSIS — M961 Postlaminectomy syndrome, not elsewhere classified: Secondary | ICD-10-CM | POA: Diagnosis not present

## 2014-06-07 DIAGNOSIS — F331 Major depressive disorder, recurrent, moderate: Secondary | ICD-10-CM | POA: Diagnosis not present

## 2014-06-07 DIAGNOSIS — G894 Chronic pain syndrome: Secondary | ICD-10-CM | POA: Diagnosis not present

## 2014-06-30 DIAGNOSIS — G8929 Other chronic pain: Secondary | ICD-10-CM | POA: Diagnosis not present

## 2014-06-30 DIAGNOSIS — F339 Major depressive disorder, recurrent, unspecified: Secondary | ICD-10-CM | POA: Diagnosis not present

## 2014-06-30 DIAGNOSIS — G35 Multiple sclerosis: Secondary | ICD-10-CM | POA: Diagnosis not present

## 2014-06-30 DIAGNOSIS — M549 Dorsalgia, unspecified: Secondary | ICD-10-CM | POA: Diagnosis not present

## 2014-08-31 ENCOUNTER — Emergency Department (HOSPITAL_COMMUNITY)
Admission: EM | Admit: 2014-08-31 | Discharge: 2014-08-31 | Disposition: A | Discharge status: Home / Self Care | Payer: BC Managed Care – PPO | Attending: Emergency Medicine | Admitting: Emergency Medicine

## 2014-08-31 ENCOUNTER — Encounter (HOSPITAL_COMMUNITY): Payer: Self-pay | Admitting: Cardiology

## 2014-08-31 DIAGNOSIS — Z79899 Other long term (current) drug therapy: Secondary | ICD-10-CM | POA: Insufficient documentation

## 2014-08-31 DIAGNOSIS — G43809 Other migraine, not intractable, without status migrainosus: Secondary | ICD-10-CM | POA: Insufficient documentation

## 2014-08-31 DIAGNOSIS — G43909 Migraine, unspecified, not intractable, without status migrainosus: Secondary | ICD-10-CM | POA: Diagnosis present

## 2014-08-31 DIAGNOSIS — G35 Multiple sclerosis: Secondary | ICD-10-CM | POA: Insufficient documentation

## 2014-08-31 HISTORY — DX: Multiple sclerosis: G35

## 2014-08-31 HISTORY — DX: Migraine, unspecified, not intractable, without status migrainosus: G43.909

## 2014-08-31 LAB — CBC WITH DIFFERENTIAL/PLATELET
Basophils Absolute: 0 10*3/uL (ref 0.0–0.1)
Basophils Relative: 0 % (ref 0–1)
EOS ABS: 0 10*3/uL (ref 0.0–0.7)
EOS PCT: 0 % (ref 0–5)
HCT: 44.3 % (ref 39.0–52.0)
HEMOGLOBIN: 15.4 g/dL (ref 13.0–17.0)
LYMPHS ABS: 0.5 10*3/uL — AB (ref 0.7–4.0)
Lymphocytes Relative: 8 % — ABNORMAL LOW (ref 12–46)
MCH: 30.4 pg (ref 26.0–34.0)
MCHC: 34.8 g/dL (ref 30.0–36.0)
MCV: 87.5 fL (ref 78.0–100.0)
MONO ABS: 0.3 10*3/uL (ref 0.1–1.0)
Monocytes Relative: 4 % (ref 3–12)
NEUTROS PCT: 88 % — AB (ref 43–77)
Neutro Abs: 5.4 10*3/uL (ref 1.7–7.7)
Platelets: 214 10*3/uL (ref 150–400)
RBC: 5.06 MIL/uL (ref 4.22–5.81)
RDW: 12.7 % (ref 11.5–15.5)
WBC: 6.1 10*3/uL (ref 4.0–10.5)

## 2014-08-31 LAB — BASIC METABOLIC PANEL
Anion gap: 9 (ref 5–15)
BUN: 21 mg/dL (ref 6–23)
CHLORIDE: 103 meq/L (ref 96–112)
CO2: 28 mmol/L (ref 19–32)
CREATININE: 0.8 mg/dL (ref 0.50–1.35)
Calcium: 9.7 mg/dL (ref 8.4–10.5)
GLUCOSE: 121 mg/dL — AB (ref 70–99)
Potassium: 3.7 mmol/L (ref 3.5–5.1)
Sodium: 140 mmol/L (ref 135–145)

## 2014-08-31 MED ORDER — DIPHENHYDRAMINE HCL 50 MG/ML IJ SOLN
25.0000 mg | Freq: Once | INTRAMUSCULAR | Status: AC
  Administered 2014-08-31: 25 mg via INTRAVENOUS
  Filled 2014-08-31: qty 1

## 2014-08-31 MED ORDER — KETOROLAC TROMETHAMINE 30 MG/ML IJ SOLN
30.0000 mg | Freq: Once | INTRAMUSCULAR | Status: AC
  Administered 2014-08-31: 30 mg via INTRAVENOUS
  Filled 2014-08-31: qty 1

## 2014-08-31 MED ORDER — METOCLOPRAMIDE HCL 5 MG/ML IJ SOLN
10.0000 mg | Freq: Once | INTRAMUSCULAR | Status: AC
  Administered 2014-08-31: 10 mg via INTRAVENOUS
  Filled 2014-08-31: qty 2

## 2014-08-31 NOTE — ED Notes (Addendum)
Woke up this morning with a "migraine".  Vomiting.   Pt crying and holding head in triage.

## 2014-08-31 NOTE — ED Provider Notes (Signed)
CSN: [id number]     Arrival date & time 08/31/14  1045 History   First MD Initiated Contact with Patient 08/31/14 1117     Chief Complaint  Patient presents with  . Migraine     (Consider location/radiation/quality/duration/timing/severity/associated sxs/prior Treatment) HPI Comments: Pt comes in today with c/o migraine. Pt states that he has a history or migraines and this is similar he just had one in a long time. No fever, blurred vision or uri symptoms. Hasn't taken anything for the symptoms. Light is bothersome. Has had multiple episodes of vomiting. Headache was not acute in onset. Not the worst headache of his lift.  The history is provided by the patient. No language interpreter was used.    Past Medical History  Diagnosis Date  . Multiple sclerosis   . Migraine    Past Surgical History  Procedure Laterality Date  . Back surgery    . Foot surgery     History reviewed. No pertinent family history. History  Substance Use Topics  . Smoking status: Never Smoker   . Smokeless tobacco: Not on file  . Alcohol Use: No    Review of Systems  All other systems reviewed and are negative.     Allergies  Sulfa antibiotics  Home Medications   Prior to Admission medications   Medication Sig Start Date End Date Taking? Authorizing Provider  baclofen (LIORESAL) 20 MG tablet Take 20 mg by mouth 4 (four) times daily as needed for muscle spasms.  04/30/13   Historical Provider, MD  Fingolimod HCl (GILENYA) 0.5 MG CAPS Take 1 capsule by mouth daily.    Historical Provider, MD  gabapentin (NEURONTIN) 300 MG capsule Take 300 mg by mouth 4 (four) times daily.  06/09/13   Historical Provider, MD  oxyCODONE-acetaminophen (PERCOCET) 7.5-325 MG per tablet Take 1 tablet by mouth 4 (four) times daily.  06/08/13   Historical Provider, MD  traMADol (ULTRAM) 50 MG tablet Take 50 mg by mouth every 6 (six) hours as needed (pain).  06/10/13   Historical Provider, MD   BP 200/82 mmHg  Pulse 74   Temp(Src) 98.1 F (36.7 C)  Resp 20  Ht  (1.803 m)  Wt 230 lb (104.327 kg)  BMI 32.09 kg/m2  SpO2 100% Physical Exam  Constitutional: He is oriented to person, place, and time. He appears well-developed and well-nourished.  HENT:  Head: Normocephalic and atraumatic.  Right Ear: External ear normal.  Left Ear: External ear normal.  Mouth/Throat: Oropharynx is clear and moist.  Eyes: Conjunctivae and EOM are normal. Pupils are equal, round, and reactive to light.  Neck: Normal range of motion. Neck supple.  Cardiovascular: Normal rate and regular rhythm.   Pulmonary/Chest: Effort normal and breath sounds normal.  Abdominal: Soft. Bowel sounds are normal.  Musculoskeletal: Normal range of motion.  Neurological: He is alert and oriented to person, place, and time. Coordination normal.  Skin: Skin is warm and dry.  Psychiatric: He has a normal mood and affect.  Nursing note and vitals reviewed.   ED Course  Procedures (including critical care time) Labs Review Labs Reviewed  BASIC METABOLIC PANEL - Abnormal; Notable for the following:    Glucose, Bld 121 (*)    All other components within normal limits  CBC WITH DIFFERENTIAL - Abnormal; Notable for the following:    Neutrophils Relative % 88 (*)    Lymphocytes Relative 8 (*)    Lymphs Abs 0.5 (*)    All other components within normal  limits    Imaging Review No results found.   EKG Interpretation None      MDM   Final diagnoses:  Other type of migraine without status migrainosus    12:18 PM Pt is sleeping at this time. Will recheck on pt in little while 1:02 PM Pt is feeling better and is ready to go home   Teressa Lower, NP 08/31/14 1302  Samuel Jester, DO 09/02/14 1342

## 2014-08-31 NOTE — Discharge Instructions (Signed)

## 2014-09-03 DIAGNOSIS — G43001 Migraine without aura, not intractable, with status migrainosus: Secondary | ICD-10-CM | POA: Diagnosis not present

## 2014-11-24 DIAGNOSIS — M62830 Muscle spasm of back: Secondary | ICD-10-CM | POA: Diagnosis not present

## 2014-11-24 DIAGNOSIS — R1011 Right upper quadrant pain: Secondary | ICD-10-CM | POA: Diagnosis not present

## 2014-11-24 DIAGNOSIS — L539 Erythematous condition, unspecified: Secondary | ICD-10-CM | POA: Diagnosis not present

## 2014-11-24 DIAGNOSIS — Z6832 Body mass index (BMI) 32.0-32.9, adult: Secondary | ICD-10-CM | POA: Diagnosis not present

## 2014-11-26 ENCOUNTER — Other Ambulatory Visit (HOSPITAL_COMMUNITY): Payer: Self-pay | Admitting: Internal Medicine

## 2014-11-26 DIAGNOSIS — R1011 Right upper quadrant pain: Secondary | ICD-10-CM

## 2014-12-01 ENCOUNTER — Ambulatory Visit (HOSPITAL_COMMUNITY)
Admission: RE | Admit: 2014-12-01 | Discharge: 2014-12-01 | Disposition: A | Discharge status: Home / Self Care | Payer: BC Managed Care – PPO | Source: Ambulatory Visit | Attending: Internal Medicine | Admitting: Internal Medicine

## 2014-12-01 ENCOUNTER — Ambulatory Visit (HOSPITAL_COMMUNITY): Payer: BC Managed Care – PPO

## 2014-12-01 DIAGNOSIS — R934 Abnormal findings on diagnostic imaging of urinary organs: Secondary | ICD-10-CM | POA: Diagnosis not present

## 2014-12-01 DIAGNOSIS — R1011 Right upper quadrant pain: Secondary | ICD-10-CM | POA: Insufficient documentation

## 2014-12-24 DIAGNOSIS — M25511 Pain in right shoulder: Secondary | ICD-10-CM | POA: Diagnosis not present

## 2014-12-24 DIAGNOSIS — M25512 Pain in left shoulder: Secondary | ICD-10-CM | POA: Diagnosis not present

## 2014-12-24 DIAGNOSIS — M542 Cervicalgia: Secondary | ICD-10-CM | POA: Diagnosis not present

## 2015-01-04 ENCOUNTER — Other Ambulatory Visit: Payer: Self-pay | Admitting: Orthopedic Surgery

## 2015-01-04 DIAGNOSIS — M542 Cervicalgia: Secondary | ICD-10-CM

## 2015-01-05 DIAGNOSIS — D72819 Decreased white blood cell count, unspecified: Secondary | ICD-10-CM | POA: Diagnosis not present

## 2015-01-05 DIAGNOSIS — G8929 Other chronic pain: Secondary | ICD-10-CM | POA: Diagnosis not present

## 2015-01-05 DIAGNOSIS — F339 Major depressive disorder, recurrent, unspecified: Secondary | ICD-10-CM | POA: Diagnosis not present

## 2015-01-05 DIAGNOSIS — M549 Dorsalgia, unspecified: Secondary | ICD-10-CM | POA: Diagnosis not present

## 2015-01-05 DIAGNOSIS — G35 Multiple sclerosis: Secondary | ICD-10-CM | POA: Diagnosis not present

## 2015-01-10 ENCOUNTER — Other Ambulatory Visit: Payer: Self-pay | Admitting: Orthopedic Surgery

## 2015-01-10 DIAGNOSIS — Z77018 Contact with and (suspected) exposure to other hazardous metals: Secondary | ICD-10-CM

## 2015-01-20 DIAGNOSIS — I10 Essential (primary) hypertension: Secondary | ICD-10-CM | POA: Diagnosis not present

## 2015-01-21 ENCOUNTER — Ambulatory Visit
Admission: RE | Admit: 2015-01-21 | Discharge: 2015-01-21 | Disposition: A | Discharge status: Home / Self Care | Payer: BC Managed Care – PPO | Source: Ambulatory Visit | Attending: Orthopedic Surgery | Admitting: Orthopedic Surgery

## 2015-01-21 DIAGNOSIS — M542 Cervicalgia: Secondary | ICD-10-CM

## 2015-01-21 DIAGNOSIS — M5022 Other cervical disc displacement, mid-cervical region: Secondary | ICD-10-CM | POA: Diagnosis not present

## 2015-01-21 DIAGNOSIS — Z77018 Contact with and (suspected) exposure to other hazardous metals: Secondary | ICD-10-CM

## 2015-01-21 DIAGNOSIS — M47812 Spondylosis without myelopathy or radiculopathy, cervical region: Secondary | ICD-10-CM | POA: Diagnosis not present

## 2015-01-26 DIAGNOSIS — E291 Testicular hypofunction: Secondary | ICD-10-CM | POA: Diagnosis not present

## 2015-01-26 DIAGNOSIS — E559 Vitamin D deficiency, unspecified: Secondary | ICD-10-CM | POA: Diagnosis not present

## 2015-01-26 DIAGNOSIS — I10 Essential (primary) hypertension: Secondary | ICD-10-CM | POA: Diagnosis not present

## 2015-01-26 DIAGNOSIS — R5383 Other fatigue: Secondary | ICD-10-CM | POA: Diagnosis not present

## 2015-02-09 DIAGNOSIS — M791 Myalgia: Secondary | ICD-10-CM | POA: Diagnosis not present

## 2015-02-09 DIAGNOSIS — M542 Cervicalgia: Secondary | ICD-10-CM | POA: Diagnosis not present

## 2015-02-09 DIAGNOSIS — M4802 Spinal stenosis, cervical region: Secondary | ICD-10-CM | POA: Diagnosis not present

## 2015-03-02 DIAGNOSIS — M47812 Spondylosis without myelopathy or radiculopathy, cervical region: Secondary | ICD-10-CM | POA: Diagnosis not present

## 2015-03-02 DIAGNOSIS — M791 Myalgia: Secondary | ICD-10-CM | POA: Diagnosis not present

## 2015-03-02 DIAGNOSIS — M542 Cervicalgia: Secondary | ICD-10-CM | POA: Diagnosis not present

## 2015-05-23 DIAGNOSIS — X32XXXD Exposure to sunlight, subsequent encounter: Secondary | ICD-10-CM | POA: Diagnosis not present

## 2015-05-23 DIAGNOSIS — L570 Actinic keratosis: Secondary | ICD-10-CM | POA: Diagnosis not present

## 2015-05-23 DIAGNOSIS — L820 Inflamed seborrheic keratosis: Secondary | ICD-10-CM | POA: Diagnosis not present

## 2015-05-23 DIAGNOSIS — Z1283 Encounter for screening for malignant neoplasm of skin: Secondary | ICD-10-CM | POA: Diagnosis not present

## 2015-05-23 DIAGNOSIS — L400 Psoriasis vulgaris: Secondary | ICD-10-CM | POA: Diagnosis not present

## 2015-06-24 ENCOUNTER — Encounter (INDEPENDENT_AMBULATORY_CARE_PROVIDER_SITE_OTHER): Payer: Self-pay | Admitting: *Deleted

## 2015-06-24 ENCOUNTER — Encounter (INDEPENDENT_AMBULATORY_CARE_PROVIDER_SITE_OTHER): Payer: Self-pay

## 2015-07-08 DIAGNOSIS — M549 Dorsalgia, unspecified: Secondary | ICD-10-CM | POA: Diagnosis not present

## 2015-07-08 DIAGNOSIS — D72819 Decreased white blood cell count, unspecified: Secondary | ICD-10-CM | POA: Diagnosis not present

## 2015-07-08 DIAGNOSIS — G35 Multiple sclerosis: Secondary | ICD-10-CM | POA: Diagnosis not present

## 2015-07-08 DIAGNOSIS — F339 Major depressive disorder, recurrent, unspecified: Secondary | ICD-10-CM | POA: Diagnosis not present

## 2015-07-08 DIAGNOSIS — Z23 Encounter for immunization: Secondary | ICD-10-CM | POA: Diagnosis not present

## 2015-07-08 DIAGNOSIS — G8929 Other chronic pain: Secondary | ICD-10-CM | POA: Diagnosis not present

## 2015-11-15 DIAGNOSIS — M5010 Cervical disc disorder with radiculopathy, unspecified cervical region: Secondary | ICD-10-CM | POA: Diagnosis not present

## 2015-11-15 DIAGNOSIS — M791 Myalgia: Secondary | ICD-10-CM | POA: Diagnosis not present

## 2015-11-15 DIAGNOSIS — M542 Cervicalgia: Secondary | ICD-10-CM | POA: Diagnosis not present

## 2016-01-17 DIAGNOSIS — M549 Dorsalgia, unspecified: Secondary | ICD-10-CM | POA: Diagnosis not present

## 2016-01-17 DIAGNOSIS — D72819 Decreased white blood cell count, unspecified: Secondary | ICD-10-CM | POA: Diagnosis not present

## 2016-01-17 DIAGNOSIS — G35 Multiple sclerosis: Secondary | ICD-10-CM | POA: Diagnosis not present

## 2016-01-17 DIAGNOSIS — F339 Major depressive disorder, recurrent, unspecified: Secondary | ICD-10-CM | POA: Diagnosis not present

## 2016-01-17 DIAGNOSIS — G8929 Other chronic pain: Secondary | ICD-10-CM | POA: Diagnosis not present

## 2016-07-19 DIAGNOSIS — Z23 Encounter for immunization: Secondary | ICD-10-CM | POA: Diagnosis not present

## 2016-07-19 DIAGNOSIS — M549 Dorsalgia, unspecified: Secondary | ICD-10-CM | POA: Diagnosis not present

## 2016-07-19 DIAGNOSIS — G35 Multiple sclerosis: Secondary | ICD-10-CM | POA: Diagnosis not present

## 2016-07-19 DIAGNOSIS — D72819 Decreased white blood cell count, unspecified: Secondary | ICD-10-CM | POA: Diagnosis not present

## 2016-07-19 DIAGNOSIS — G8929 Other chronic pain: Secondary | ICD-10-CM | POA: Diagnosis not present

## 2016-07-19 DIAGNOSIS — F339 Major depressive disorder, recurrent, unspecified: Secondary | ICD-10-CM | POA: Diagnosis not present

## 2016-08-04 ENCOUNTER — Emergency Department (HOSPITAL_COMMUNITY): Payer: BC Managed Care – PPO

## 2016-08-04 ENCOUNTER — Emergency Department (HOSPITAL_COMMUNITY): Payer: BC Managed Care – PPO | Admitting: Certified Registered"

## 2016-08-04 ENCOUNTER — Encounter (HOSPITAL_COMMUNITY)
Admission: EM | Disposition: A | Discharge status: Home / Self Care | Payer: Self-pay | Source: Home / Self Care | Attending: Emergency Medicine

## 2016-08-04 ENCOUNTER — Ambulatory Visit (HOSPITAL_COMMUNITY)
Admission: EM | Admit: 2016-08-04 | Discharge: 2016-08-04 | Disposition: A | Discharge status: Home / Self Care | Payer: BC Managed Care – PPO | Attending: Emergency Medicine | Admitting: Emergency Medicine

## 2016-08-04 ENCOUNTER — Encounter (HOSPITAL_COMMUNITY): Payer: Self-pay | Admitting: Emergency Medicine

## 2016-08-04 DIAGNOSIS — I10 Essential (primary) hypertension: Secondary | ICD-10-CM | POA: Diagnosis not present

## 2016-08-04 DIAGNOSIS — S61412A Laceration without foreign body of left hand, initial encounter: Secondary | ICD-10-CM | POA: Diagnosis not present

## 2016-08-04 DIAGNOSIS — S61422A Laceration with foreign body of left hand, initial encounter: Secondary | ICD-10-CM

## 2016-08-04 DIAGNOSIS — Z23 Encounter for immunization: Secondary | ICD-10-CM | POA: Diagnosis not present

## 2016-08-04 DIAGNOSIS — W3189XA Contact with other specified machinery, initial encounter: Secondary | ICD-10-CM | POA: Diagnosis not present

## 2016-08-04 DIAGNOSIS — G35 Multiple sclerosis: Secondary | ICD-10-CM | POA: Insufficient documentation

## 2016-08-04 DIAGNOSIS — S61411A Laceration without foreign body of right hand, initial encounter: Secondary | ICD-10-CM | POA: Diagnosis not present

## 2016-08-04 HISTORY — DX: Essential (primary) hypertension: I10

## 2016-08-04 HISTORY — PX: I&D EXTREMITY: SHX5045

## 2016-08-04 SURGERY — IRRIGATION AND DEBRIDEMENT EXTREMITY
Anesthesia: General | Site: Hand | Laterality: Left

## 2016-08-04 MED ORDER — LIDOCAINE 2% (20 MG/ML) 5 ML SYRINGE
INTRAMUSCULAR | Status: DC | PRN
  Administered 2016-08-04: 100 mg via INTRAVENOUS

## 2016-08-04 MED ORDER — ONDANSETRON HCL 4 MG/2ML IJ SOLN
INTRAMUSCULAR | Status: DC | PRN
Start: 2016-08-04 — End: 2016-08-04
  Administered 2016-08-04: 4 mg via INTRAVENOUS

## 2016-08-04 MED ORDER — MIDAZOLAM HCL 2 MG/2ML IJ SOLN
INTRAMUSCULAR | Status: AC
  Filled 2016-08-04: qty 2

## 2016-08-04 MED ORDER — SUFENTANIL CITRATE 50 MCG/ML IV SOLN
INTRAVENOUS | Status: AC
  Filled 2016-08-04: qty 1

## 2016-08-04 MED ORDER — PROPOFOL 10 MG/ML IV BOLUS
INTRAVENOUS | Status: AC
  Filled 2016-08-04: qty 20

## 2016-08-04 MED ORDER — PROPOFOL 10 MG/ML IV BOLUS
INTRAVENOUS | Status: DC | PRN
  Administered 2016-08-04: 50 mg via INTRAVENOUS
  Administered 2016-08-04: 200 mg via INTRAVENOUS

## 2016-08-04 MED ORDER — MORPHINE SULFATE (PF) 4 MG/ML IV SOLN
4.0000 mg | Freq: Once | INTRAVENOUS | Status: DC
Start: 2016-08-04 — End: 2016-08-04

## 2016-08-04 MED ORDER — SUCCINYLCHOLINE CHLORIDE 200 MG/10ML IV SOSY
PREFILLED_SYRINGE | INTRAVENOUS | Status: DC | PRN
  Administered 2016-08-04: 100 mg via INTRAVENOUS

## 2016-08-04 MED ORDER — OXYCODONE HCL 5 MG/5ML PO SOLN: 5.0000 mg | Freq: Once | ORAL | Status: DC | PRN

## 2016-08-04 MED ORDER — TETANUS-DIPHTH-ACELL PERTUSSIS 5-2.5-18.5 LF-MCG/0.5 IM SUSP
0.5000 mL | Freq: Once | INTRAMUSCULAR | Status: AC
Start: 2016-08-04 — End: 2016-08-04
  Administered 2016-08-04: 0.5 mL via INTRAMUSCULAR
  Filled 2016-08-04: qty 0.5

## 2016-08-04 MED ORDER — ONDANSETRON HCL 4 MG/2ML IJ SOLN
4.0000 mg | Freq: Once | INTRAMUSCULAR | Status: AC
  Administered 2016-08-04: 4 mg via INTRAVENOUS
  Filled 2016-08-04: qty 2

## 2016-08-04 MED ORDER — MORPHINE SULFATE (PF) 4 MG/ML IV SOLN
INTRAVENOUS | Status: AC
  Filled 2016-08-04: qty 1

## 2016-08-04 MED ORDER — MORPHINE SULFATE (PF) 4 MG/ML IV SOLN
4.0000 mg | Freq: Once | INTRAVENOUS | Status: AC
  Administered 2016-08-04: 4 mg via INTRAVENOUS

## 2016-08-04 MED ORDER — FENTANYL CITRATE (PF) 100 MCG/2ML IJ SOLN: 25.0000 ug | INTRAMUSCULAR | Status: DC | PRN

## 2016-08-04 MED ORDER — SODIUM CHLORIDE 0.9 % IR SOLN
Status: DC | PRN
Start: 2016-08-04 — End: 2016-08-04
  Administered 2016-08-04: 2000 mL

## 2016-08-04 MED ORDER — ONDANSETRON HCL 4 MG/2ML IJ SOLN: 4.0000 mg | Freq: Four times a day (QID) | INTRAMUSCULAR | Status: DC | PRN

## 2016-08-04 MED ORDER — MIDAZOLAM HCL 5 MG/5ML IJ SOLN
INTRAMUSCULAR | Status: DC | PRN
Start: 2016-08-04 — End: 2016-08-04
  Administered 2016-08-04: 2 mg via INTRAVENOUS

## 2016-08-04 MED ORDER — CEFAZOLIN SODIUM 1 G IJ SOLR
INTRAMUSCULAR | Status: DC | PRN
  Administered 2016-08-04: 2 g via INTRAMUSCULAR

## 2016-08-04 MED ORDER — BUPIVACAINE HCL (PF) 0.25 % IJ SOLN
INTRAMUSCULAR | Status: DC | PRN
  Administered 2016-08-04: 10 mL

## 2016-08-04 MED ORDER — BUPIVACAINE HCL (PF) 0.25 % IJ SOLN
INTRAMUSCULAR | Status: AC
  Filled 2016-08-04: qty 30

## 2016-08-04 MED ORDER — MORPHINE SULFATE (PF) 4 MG/ML IV SOLN
4.0000 mg | Freq: Once | INTRAVENOUS | Status: AC
  Administered 2016-08-04: 4 mg via INTRAVENOUS
  Filled 2016-08-04: qty 1

## 2016-08-04 MED ORDER — LACTATED RINGERS IV SOLN
INTRAVENOUS | Status: DC | PRN
  Administered 2016-08-04: 19:00:00 via INTRAVENOUS

## 2016-08-04 MED ORDER — SUFENTANIL CITRATE 50 MCG/ML IV SOLN
INTRAVENOUS | Status: DC | PRN
  Administered 2016-08-04 (×3): 10 ug via INTRAVENOUS

## 2016-08-04 MED ORDER — OXYCODONE HCL 5 MG PO TABS: 5.0000 mg | ORAL_TABLET | Freq: Once | ORAL | Status: DC | PRN

## 2016-08-04 MED ORDER — DEXAMETHASONE SODIUM PHOSPHATE 10 MG/ML IJ SOLN
INTRAMUSCULAR | Status: DC | PRN
  Administered 2016-08-04: 10 mg via INTRAVENOUS

## 2016-08-04 SURGICAL SUPPLY — 27 items
BANDAGE ACE 3X5.8 VEL STRL LF (GAUZE/BANDAGES/DRESSINGS) ×2 IMPLANT
BANDAGE ACE 4X5 VEL STRL LF (GAUZE/BANDAGES/DRESSINGS) IMPLANT
BANDAGE ELASTIC 3 VELCRO ST LF (GAUZE/BANDAGES/DRESSINGS) IMPLANT
BNDG GAUZE ELAST 4 BULKY (GAUZE/BANDAGES/DRESSINGS) ×2 IMPLANT
CORDS BIPOLAR (ELECTRODE) ×2 IMPLANT
CUFF TOURNIQUET SINGLE 18IN (TOURNIQUET CUFF) ×2 IMPLANT
DRAPE SURG 17X23 STRL (DRAPES) ×3 IMPLANT
GAUZE SPONGE 4X4 12PLY STRL (GAUZE/BANDAGES/DRESSINGS) ×3 IMPLANT
GAUZE XEROFORM 1X8 LF (GAUZE/BANDAGES/DRESSINGS) ×3 IMPLANT
GLOVE BIOGEL M 8.0 STRL (GLOVE) ×3 IMPLANT
GOWN STRL REUS W/ TWL LRG LVL3 (GOWN DISPOSABLE) ×2 IMPLANT
GOWN STRL REUS W/TWL LRG LVL3 (GOWN DISPOSABLE) ×6
KIT BASIN OR (CUSTOM PROCEDURE TRAY) ×3 IMPLANT
KIT ROOM TURNOVER OR (KITS) ×3 IMPLANT
NDL HYPO 25GX1X1/2 BEV (NEEDLE) IMPLANT
NEEDLE HYPO 25GX1X1/2 BEV (NEEDLE) ×3 IMPLANT
NS IRRIG 1000ML POUR BTL (IV SOLUTION) ×5 IMPLANT
PACK ORTHO EXTREMITY (CUSTOM PROCEDURE TRAY) ×3 IMPLANT
PAD ARMBOARD 7.5X6 YLW CONV (MISCELLANEOUS) ×6 IMPLANT
PAD CAST 4YDX4 CTTN HI CHSV (CAST SUPPLIES) IMPLANT
PADDING CAST COTTON 4X4 STRL (CAST SUPPLIES) ×3
SPONGE GAUZE 4X4 12PLY STER LF (GAUZE/BANDAGES/DRESSINGS) ×2 IMPLANT
SUT ETHILON 4 0 PS 2 18 (SUTURE) ×4 IMPLANT
SYR CONTROL 10ML LL (SYRINGE) ×2 IMPLANT
TOWEL OR 17X26 10 PK STRL BLUE (TOWEL DISPOSABLE) ×3 IMPLANT
TUBE CONNECTING 12'X1/4 (SUCTIONS) ×1
TUBE CONNECTING 12X1/4 (SUCTIONS) ×2 IMPLANT

## 2016-08-04 NOTE — ED Notes (Signed)
Pt reports pain that radiates from left wrist to left shoulder. Will notify EDP

## 2016-08-04 NOTE — ED Provider Notes (Signed)
Pt transferred here from [facility] to see Dr. Izora Ribasoley for a repair of deep/contaminated hand laceration.  Pt remains stable.  He does not want anything else for pain now.  Pt d/w Dr. Izora Ribasoley who will take him to the OR.   Jacalyn LefevreJulie Jojo Geving, MD 08/04/16 [phone]

## 2016-08-04 NOTE — Transfer of Care (Signed)
Immediate Anesthesia Transfer of Care Note  Patient: Paul Pope  Procedure(s) Performed: Procedure(s): IRRIGATION AND DEBRIDEMENT EXTREMITY LEFT HAND (Left)  Patient Location: PACU  Anesthesia Type:General  Level of Consciousness: awake, alert , oriented and patient cooperative  Airway & Oxygen Therapy: Patient Spontanous Breathing and Patient connected to nasal cannula oxygen  Post-op Assessment: Report given to RN, Post -op Vital signs reviewed and stable and Patient moving all extremities X 4  Post vital signs: Reviewed and stable  Last Vitals:  Vitals:   08/04/16 1845 08/04/16 2032  BP: 132/90 (P) 137/78  Pulse: 73   Resp: 16 (P) 12  Temp:  (P) 36.5 C    Last Pain:  Vitals:   08/04/16 2032  TempSrc:   PainSc: (P) 0-No pain         Complications: No apparent anesthesia complications

## 2016-08-04 NOTE — ED Provider Notes (Addendum)
[facility]-EMERGENCY DEPT Provider Note   CSN: [id number] Arrival date & time: 08/04/16  1410     History   Chief Complaint Chief Complaint  Patient presents with  . Extremity Laceration    HPI Paul Pope is a 60 y.o. male.  HPI  Hand injury that occurred just prior to arrival when the patient was splitting wood with a hydraulic wood splitter - states that his left hand got caught and crushed as the axe blade pushed into the hand / wood combo - states that the pain is severe, constant, worse with moving the hand / thumb and worse with palpation - associated with bleeding and mild numbness to the finger (index)  Past Medical History:  Diagnosis Date  . Hypertension   . Migraine   . Multiple sclerosis [facility])     Patient Active Problem List   Diagnosis Date Noted  . Chest pain 06/12/2013  . Esophageal reflux 06/11/2013  . Mixed hyperlipidemia 06/11/2013  . Elevated blood pressure reading without diagnosis of hypertension 06/11/2013    Past Surgical History:  Procedure Laterality Date  . BACK SURGERY    . FOOT SURGERY         Home Medications    Prior to Admission medications   Medication Sig Start Date End Date Taking? Authorizing Provider  acidophilus (RISAQUAD) CAPS capsule Take 1 capsule by mouth daily.   Yes Historical Provider, MD  baclofen (LIORESAL) 20 MG tablet Take 20 mg by mouth 4 (four) times daily as needed for muscle spasms.  04/30/13  Yes Historical Provider, MD  cholecalciferol (VITAMIN D) 1000 units tablet Take 3,000 Units by mouth daily.   Yes Historical Provider, MD  desvenlafaxine (PRISTIQ) 100 MG 24 hr tablet Take 1 tablet by mouth daily. 07/19/16  Yes Historical Provider, MD  escitalopram (LEXAPRO) 20 MG tablet Take 20 mg by mouth daily. 06/30/14  Yes Historical Provider, MD  Fingolimod HCl (GILENYA) 0.5 MG CAPS Take 1 capsule by mouth daily.   Yes Historical Provider, MD  gabapentin (NEURONTIN) 300 MG capsule Take 300 mg by mouth 4 (four) times  daily.  06/09/13  Yes Historical Provider, MD  lisinopril (PRINIVIL,ZESTRIL) 10 MG tablet Take 1 tablet by mouth daily. 07/30/16  Yes Historical Provider, MD  oxyCODONE-acetaminophen (PERCOCET) 7.5-325 MG per tablet Take 1 tablet by mouth 4 (four) times daily.  06/08/13  Yes Historical Provider, MD  polyethylene glycol (MIRALAX / GLYCOLAX) packet Take 17 g by mouth daily as needed (constipation).   Yes Historical Provider, MD  tiZANidine (ZANAFLEX) 4 MG tablet Take 1 tablet by mouth daily. 05/31/16  Yes Historical Provider, MD  traMADol (ULTRAM) 50 MG tablet Take 50 mg by mouth every 6 (six) hours as needed (pain).  06/10/13  Yes Historical Provider, MD    Family History No family history on file.  Social History Social History  Substance Use Topics  . Smoking status: Never Smoker  . Smokeless tobacco: Never Used  . Alcohol use No     Allergies   Sulfa antibiotics   Review of Systems Review of Systems  All other systems reviewed and are negative.    Physical Exam Updated Vital Signs BP 122/78   Pulse 82   Temp 98 F (36.7 C)   Resp 16   Ht 5\' 11"  (1.803 m)   Wt 224 lb (101.6 kg)   SpO2 99%   BMI 31.24 kg/m   Physical Exam  Constitutional: He appears well-developed and well-nourished. He appears distressed.  HENT:  Head: Normocephalic and atraumatic.  Mouth/Throat: Oropharynx is clear and moist. No oropharyngeal exudate.  Eyes: Conjunctivae and EOM are normal. Pupils are equal, round, and reactive to light. Right eye exhibits no discharge. Left eye exhibits no discharge. No scleral icterus.  Neck: Normal range of motion. Neck supple. No JVD present. No thyromegaly present.  Cardiovascular: Normal rate, regular rhythm, normal heart sounds and intact distal pulses.  Exam reveals no gallop and no friction rub.   No murmur heard. Pulmonary/Chest: Effort normal and breath sounds normal. No respiratory distress. He has no wheezes. He has no rales.  Abdominal: Soft. Bowel  sounds are normal. He exhibits no distension and no mass. There is no tenderness.  Musculoskeletal: Normal range of motion. He exhibits tenderness and deformity. He exhibits no edema.  All 4 extremities without trauma except for the left hand which has a large deep laceration between the first and second digits, between the thumb and the forefinger, this is very deep, there is extruding muscle, there is organic material in the wound, there is mild bleeding, he is able to both flex and extend the fingers with normal range of motion against resistance  Lymphadenopathy:    He has no cervical adenopathy.  Neurological: He is alert. Coordination normal.  Mild numbness to the index finger over the palmar and extensor surface more towards the radial than the ulnar side  Skin: Skin is warm. No rash noted. He is diaphoretic. No erythema.  Wound as above  Psychiatric: He has a normal mood and affect. His behavior is normal.  Nursing note and vitals reviewed.    ED Treatments / Results  Labs (all labs ordered are listed, but only abnormal results are displayed) Labs Reviewed - No data to display  Radiology Dg Hand Complete Left  Result Date: 08/04/2016 CLINICAL DATA:  Left hand laceration. EXAM: LEFT HAND - COMPLETE 3+ VIEW COMPARISON:  None. FINDINGS: There is no evidence of fracture or dislocation. There is no evidence of arthropathy or other focal bone abnormality. Soft tissue defect is seen in keeping with the given history of laceration. There is a punctate radiopaque foreign body which is either within soft tissues or on the skin surface, seen on all 3 images. IMPRESSION: No evidence of fracture. Soft tissue laceration. Punctate radiopaque foreign body with uncertain position overlying the fifth metacarpal bone. Electronically Signed   By: Ted Mcalpine M.D.   On: 08/04/2016 15:31    Procedures Procedures (including critical care time)  Medications Ordered in ED Medications  Tdap  (BOOSTRIX) injection 0.5 mL (0.5 mLs Intramuscular Given 08/04/16 1526)  morphine 4 MG/ML injection 4 mg (4 mg Intravenous Given 08/04/16 1524)     Initial Impression / Assessment and Plan / ED Course  I have reviewed the triage vital signs and the nursing notes.  Pertinent labs & imaging results that were available during my care of the patient were reviewed by me and considered in my medical decision making (see chart for details).  Clinical Course    Large deep wound of the hand, contaminated, needs tetanus is not up-to-date, pain medicine, will need washout, and consultation, imaging, pain control  Discussed the case with the patient as well as with Dr. Romeo Apple - recommends hand specialist - then d/w  Dr. Izora Ribas of hand surgery who is agreeable to take the patient to the operating room. The patient desires to go by private vehicle which at this time I feel is okay given that he has not lost  very much blood has normal blood pressure, heart rate and is of normal mental status able to make those decisions. He has been given pain medication, his wife will drive him, the wound has been dressed and his tetanus status has been updated.  Pt instructed not to eat or drink before potential surgery   Final Clinical Impressions(s) / ED Diagnoses   Final diagnoses:  Laceration of left hand with foreign body, initial encounter    New Prescriptions New Prescriptions   No medications on file     Eber HongBrian Raiyan Dalesandro, MD 08/04/16 1647    Eber HongBrian Kristi Norment, MD 08/04/16 1714

## 2016-08-04 NOTE — ED Notes (Signed)
Page to Dr. Izora Ribasoley to let him know that pt is here and is in room 38

## 2016-08-04 NOTE — Anesthesia Preprocedure Evaluation (Signed)
Anesthesia Evaluation  Patient identified by MRN, date of birth, ID band Patient awake    Reviewed: Allergy & Precautions, H&P , NPO status , Patient's Chart, lab work & pertinent test results  Airway Mallampati: II   Neck ROM: full    Dental   Pulmonary neg pulmonary ROS,    breath sounds clear to auscultation       Cardiovascular hypertension,  Rhythm:regular Rate:Normal     Neuro/Psych  Headaches, Multiple sclerosis    GI/Hepatic GERD  ,  Endo/Other    Renal/GU      Musculoskeletal   Abdominal   Peds  Hematology   Anesthesia Other Findings   Reproductive/Obstetrics                             Anesthesia Physical Anesthesia Plan  ASA: II  Anesthesia Plan: General   Post-op Pain Management:    Induction: Intravenous  Airway Management Planned: LMA  Additional Equipment:   Intra-op Plan:   Post-operative Plan:   Informed Consent: I have reviewed the patients History and Physical, chart, labs and discussed the procedure including the risks, benefits and alternatives for the proposed anesthesia with the patient or authorized representative who has indicated his/her understanding and acceptance.     Plan Discussed with: CRNA, Anesthesiologist and Surgeon  Anesthesia Plan Comments:         Anesthesia Quick Evaluation

## 2016-08-04 NOTE — ED Triage Notes (Signed)
Pt reports he got his L hand caught by a wood splitter approx 20 min ago. Pt denies blood thinner use. Bleeding controlled at this time. Fingers are warm, cap refill >3 seconds, sensation intact.

## 2016-08-04 NOTE — Discharge Instructions (Signed)
Discharge Instructions:  Keep your dressing clean, dry and in place until instructed to remove by Dr. Elyjah Hazan.  If the dressing becomes dirty or wet call the office for instructions during business hours. Elevate the extremity to help with swelling, this will also help with any discomfort. Take your medication as prescribed. No lifting with the injured  extremity. If you feel that the dressing is too tight, you may loosen it, but keep it on; finger tips should be pink; if there is a concern, call the office. [phone] Ice may be used if the injury is a fracture, do not apply ice directly to the skin. Please call the office on the next business day after discharge to arrange a follow up appointment.  Call [phone] between the hours of 9am - 5pm M-Th or 9am - 1pm on Fri. For most hand injuries and/or conditions, you may return to work using the uninjured hand (one handed duty) within 24-72 hours.  A detailed note will be provided to you at your follow up appointment or may contact the office prior to your follow up.    

## 2016-08-04 NOTE — H&P (Signed)
Reason for Consult:complex laceration Referring Physician: ER  CC:I cut my hand in a log spllitter  HPI:  Paul BillingsDaniel E Pope is an 60 y.o. right handed male who presents with   Complex laceration of Left hand from log splitter, c/o altered sensation radial IF, bleeding, pain .  [facility] and referred here. Pain is rated at    8/10 and is described as sharp.  Pain is constant.  Pain is made better by rest/immobilization, worse with motion.   Associated signs/symptoms: altered sensation of radial IF Previous treatment:    Past Medical History:  Diagnosis Date  . Hypertension   . Migraine   . Multiple sclerosis (HCC)     Past Surgical History:  Procedure Laterality Date  . BACK SURGERY    . FOOT SURGERY      History reviewed. No pertinent family history.  Social History:  reports that he has never smoked. He has never used smokeless tobacco. He reports that he does not drink alcohol or use drugs.  Allergies:  Allergies  Allergen Reactions  . Sulfa Antibiotics     Medications: I have reviewed the patient's current medications.  No results found for this or any previous visit (from the past 48 hour(s)).  Dg Hand Complete Left  Result Date: 08/04/2016 CLINICAL DATA:  Left hand laceration. EXAM: LEFT HAND - COMPLETE 3+ VIEW COMPARISON:  None. FINDINGS: There is no evidence of fracture or dislocation. There is no evidence of arthropathy or other focal bone abnormality. Soft tissue defect is seen in keeping with the given history of laceration. There is a punctate radiopaque foreign body which is either within soft tissues or on the skin surface, seen on all 3 images. IMPRESSION: No evidence of fracture. Soft tissue laceration. Punctate radiopaque foreign body with uncertain position overlying the fifth metacarpal bone. Electronically Signed   By: Ted Mcalpineobrinka  Dimitrova M.D.   On: 08/04/2016 15:31    Pertinent items are noted in HPI. Temp:  [97.9 F (36.6  C)-98 F (36.7 C)] 97.9 F (36.6 C) (12/16 1755) Pulse Rate:  [73-87] 73 (12/16 1845) Resp:  [16] 16 (12/16 1845) BP: (122-179)/(78-94) 132/90 (12/16 1845) SpO2:  [93 %-99 %] 94 % (12/16 1845) Weight:  [101.6 kg (224 lb)] 101.6 kg (224 lb) (12/16 1416) General appearance: alert and cooperative Resp: clear to auscultation bilaterally Cardio: regular rate and rhythm GI: soft, non-tender; bowel sounds normal; no masses,  no organomegaly Extremities: RUE - wnl/ LUE gaping wound in dorsal thena space wthout muscle belly protruding, able to flex extend if, thumb some weaknes with resisted ab/addduction, sensation radial if prolonged.   Assessment: Complex laceration L hand with probable muscle/tendon laceration, ? radial digital nerve laceration IF Plan: Needs exploration, repair of damaged structures I have discussed this treatment plan in detail with patient including the risks of the recommended treatment and  surgery, the benefits and the alternatives.  The patient  understands that additional treatment may be necessary.  Johnette AbrahamHarrill C Allanah Mcfarland 08/04/2016, 7:28 PM

## 2016-08-04 NOTE — ED Notes (Signed)
[facility]

## 2016-08-04 NOTE — ED Notes (Signed)
Bulky dressing applied to hand.  IV left intact per Dr. Hyacinth MeekerMiller and wrapped in Kerlix.  [facility]  Left with wife.

## 2016-08-04 NOTE — Anesthesia Procedure Notes (Signed)
Procedure Name: Intubation Date/Time: 08/04/2016 7:45 PM Performed by: Melina SchoolsBANKS, Oluwatosin Higginson J Pre-anesthesia Checklist: Patient identified, Emergency Drugs available, Suction available, Patient being monitored and Timeout performed Patient Re-evaluated:Patient Re-evaluated prior to inductionOxygen Delivery Method: Circle system utilized Preoxygenation: Pre-oxygenation with 100% oxygen Intubation Type: IV induction, Rapid sequence and Cricoid Pressure applied Laryngoscope Size: Mac and 3 Grade View: Grade II Tube type: Oral Tube size: 7.5 mm Airway Equipment and Method: Stylet Placement Confirmation: ETT inserted through vocal cords under direct vision,  positive ETCO2 and breath sounds checked- equal and bilateral Secured at: 23 cm Tube secured with: Tape Dental Injury: Teeth and Oropharynx as per pre-operative assessment

## 2016-08-04 NOTE — ED Notes (Signed)
Pt arrives from [facility] by private vehicle to see Dr. Izora Ribas.  Pt still has his IV in place and has a gauze dressing in place.  Pt pain is 4/10

## 2016-08-04 NOTE — Anesthesia Postprocedure Evaluation (Signed)
Anesthesia Post Note  Patient: Paul Pope  Procedure(Pope) Performed: Procedure(Pope) (LRB): IRRIGATION AND DEBRIDEMENT EXTREMITY LEFT HAND (Left)  Patient location during evaluation: PACU Anesthesia Type: General Level of consciousness: awake and alert and patient cooperative Pain management: pain level controlled Vital Signs Assessment: post-procedure vital signs reviewed and stable Respiratory status: spontaneous breathing and respiratory function stable Cardiovascular status: stable Anesthetic complications: no    Last Vitals:  Vitals:   08/04/16 2045 08/04/16 2100  BP: 134/79 (!) 141/85  Pulse:    Resp:    Temp:  36.6 C    Last Pain:  Vitals:   08/04/16 2125  TempSrc:   PainSc: 0-No pain                 Paul Pope

## 2016-08-05 ENCOUNTER — Encounter (HOSPITAL_COMMUNITY): Payer: Self-pay | Admitting: General Surgery

## 2016-08-25 NOTE — Op Note (Signed)
NAMEFABIANO, THIRY                 ACCOUNT NO.:  [id number]  MEDICAL RECORD NO.:  [id number]  LOCATION:  [facility]                         FACILITY:  [facility]  PHYSICIAN:  [name], MD    DATE OF BIRTH:  Jun 08, 1956  DATE OF PROCEDURE:  08/04/2016 DATE OF DISCHARGE:                              OPERATIVE REPORT   PREOPERATIVE DIAGNOSIS:  Complex laceration to the right hand.  POSTOPERATIVE DIAGNOSIS:  Complex laceration to the right hand.  PROCEDURE:  Exploration of complex wound and repair of complex laceration, totaling 7 cm.  INDICATIONS:  Mr. Sloane is a 61 year old gentleman, who presented with a complex laceration and gaping wound from a log splitter.  Risks, benefits, alternatives of surgical exploration repair were discussed with him.  He agreed with these.  Consent was obtained.  PROCEDURE IN DETAIL:  Patient was taken to the operating room, placed supine on the operating table.  General anesthesia was administered without difficulty.  The right upper extremity was prepped and draped in normal sterile fashion.  Tourniquet was used on the upper arm after the arm was exsanguinated.  The tourniquet was inflated to 250 mmHg.  The large gaping wound on the right dorsal hand between the index and the thumb dorsally was explored.  There is quite a bit of tearing muscle, quite a bit of dirt, and other debris within the wound.  This was all sharply debrided with a knife and scissors, full-thickness skin, muscle. This was thoroughly irrigated with irrigation solution.  After thorough irrigation, the wound was brought into close proximity in layers, deep layers with some interrupted 3-0 Vicryl to approximate the muscles as well as multiple interrupted and vertical mattress 5-0 nylon to approximate the skin.  A large sterile dressing was placed after the procedure.  After tourniquet was released, all fingers were nice and pink.  The patient tolerated the procedure well and was taken  to recovery room in stable condition.     [name], MD     HCC/MEDQ  D:  08/24/2016  T:  08/25/2016  Job:  [id number]

## 2016-11-01 DIAGNOSIS — M961 Postlaminectomy syndrome, not elsewhere classified: Secondary | ICD-10-CM | POA: Diagnosis not present

## 2016-11-01 DIAGNOSIS — G894 Chronic pain syndrome: Secondary | ICD-10-CM | POA: Diagnosis not present

## 2016-11-01 DIAGNOSIS — Z79891 Long term (current) use of opiate analgesic: Secondary | ICD-10-CM | POA: Diagnosis not present

## 2016-11-01 DIAGNOSIS — M47816 Spondylosis without myelopathy or radiculopathy, lumbar region: Secondary | ICD-10-CM | POA: Diagnosis not present

## 2016-11-08 DIAGNOSIS — Z79891 Long term (current) use of opiate analgesic: Secondary | ICD-10-CM | POA: Diagnosis not present

## 2016-11-08 DIAGNOSIS — M47816 Spondylosis without myelopathy or radiculopathy, lumbar region: Secondary | ICD-10-CM | POA: Diagnosis not present

## 2016-11-08 DIAGNOSIS — M961 Postlaminectomy syndrome, not elsewhere classified: Secondary | ICD-10-CM | POA: Diagnosis not present

## 2016-11-08 DIAGNOSIS — G894 Chronic pain syndrome: Secondary | ICD-10-CM | POA: Diagnosis not present

## 2016-11-27 ENCOUNTER — Encounter (INDEPENDENT_AMBULATORY_CARE_PROVIDER_SITE_OTHER): Payer: Self-pay | Admitting: Physical Medicine and Rehabilitation

## 2016-11-27 ENCOUNTER — Ambulatory Visit (INDEPENDENT_AMBULATORY_CARE_PROVIDER_SITE_OTHER): Payer: Medicare Other

## 2016-11-27 ENCOUNTER — Ambulatory Visit (INDEPENDENT_AMBULATORY_CARE_PROVIDER_SITE_OTHER): Payer: BC Managed Care – PPO | Admitting: Physical Medicine and Rehabilitation

## 2016-11-27 VITALS — BP 128/82 | HR 75

## 2016-11-27 DIAGNOSIS — M549 Dorsalgia, unspecified: Secondary | ICD-10-CM | POA: Diagnosis not present

## 2016-11-27 DIAGNOSIS — M542 Cervicalgia: Secondary | ICD-10-CM | POA: Diagnosis not present

## 2016-11-27 DIAGNOSIS — M47812 Spondylosis without myelopathy or radiculopathy, cervical region: Secondary | ICD-10-CM

## 2016-11-27 DIAGNOSIS — M609 Myositis, unspecified: Secondary | ICD-10-CM | POA: Diagnosis not present

## 2016-11-27 NOTE — Progress Notes (Signed)
Paul Pope - 61 y.o. male MRN [id number]  Date of birth: Dec 26, 1955  Office Visit Note: Visit Date: 11/27/2016 PCP: Dwana Melena, MD Referred by: Benita Stabile, MD  Subjective: Chief Complaint  Patient presents with  . Neck - Pain   HPI: Paul Pope is a 61 year old gentleman with history of multiple sclerosis and chronic pain syndrome. He is followed by Dr. Vear Clock in pain management. I originally saw the patient in 2016 on just a few occasions at the request of Dr. Lajoyce Corners who has seen him for some number of years. An MRI of the cervical spine in 2016 had been performed and this is reviewed again below. The patient did have some degenerative spondylosis at C3-for without stenosis or nerve entrapment. There was some mild arthritis of the C1-to articulation. His symptoms of always been more myofascial pain. The patient carries a lot of tension in his neck and shoulders and is very tight overall. He has increased tone. He does take quite a bit of baclofen but does this on an as-needed basis. He is not taking Zanaflex at this point but hasn't the past with only mild relief. Again medications are managed by Dr. Vear Clock. He comes in today with pain in the middle of the upper back and lower neck that spreads out across both sides or on the C7 spinous process over the shoulder regions and parascapular region. He tells me that the prior neck pain that he was having was actually an abscess tooth of the time. He is anxious that this pain is different than it was in the past. His been having some shoulder pain as well as pain is nagging and off and on all winter. He thinks this is similar to the pain and last year when he receives trigger point injections. He has not had recent physical therapy and really tells me that he had no relief in the past for many physical therapist. He is denying radicular pain down the arms. He does get tingling type sensations in multiple areas however. He does have widespread body pain.  He's had prior lumbar surgery.   Review of Systems  Constitutional: Negative for chills, fever, malaise/fatigue and weight loss.  HENT: Negative for hearing loss and sinus pain.   Eyes: Negative for blurred vision, double vision and photophobia.  Respiratory: Negative for cough and shortness of breath.   Cardiovascular: Negative for chest pain, palpitations and leg swelling.  Gastrointestinal: Negative for abdominal pain, nausea and vomiting.  Genitourinary: Negative for flank pain.  Musculoskeletal: Positive for back pain and neck pain. Negative for myalgias.  Skin: Negative for itching and rash.  Neurological: Positive for tingling. Negative for tremors, focal weakness and weakness.  Endo/Heme/Allergies: Negative.   Psychiatric/Behavioral: Negative for depression.  All other systems reviewed and are negative.  Otherwise per HPI.  Assessment & Plan: Visit Diagnoses:  1. Upper back pain   2. Cervicalgia   3. Myofascitis   4. Cervical spondylosis without myelopathy     Plan: Findings:  Chronic pain syndrome and chronic worsening low cervical upper mid back paraspinal and scapular pain. I still think his pain is myofascial by large degree. He does have a cervical spine findings of C3-4 spondylosis on a fairly recent MRI of 2 years ago. I really do not think that fits with his overall pain pattern. He continues to follow with Dr. Vear Clock and pain management. I think at this point it would be wise to regroup with a really good physical  therapist. The patient does live in [place] but is willing to travel down here for a few sessions of physical therapy. I want to refer him to [facility] physical therapy for dry needling and manual treatment. Otherwise I think he probably would do better taking his baclofen a little bit more scheduled than as needed but he should follow up with Dr. Vear Clock for his medical management and other care. I'm not sure I have much to offer that Dr. Vear Clock cannot  offer as well in his office.    Meds & Orders: No orders of the defined types were placed in this encounter.   Orders Placed This Encounter  Procedures  . XR Thoracic Spine 2 View  . Ambulatory referral to Physical Therapy    Follow-up: Return if symptoms worsen or fail to improve.   Procedures: No procedures performed  No notes on file   Clinical History: MRI CERVICAL SPINE WITHOUT CONTRAST 01/21/2015   FINDINGS: The foramen magnum is widely patent. There is ordinary mild osteoarthritis at the C1-2 articulation but no encroachment upon the neural spaces.  C2-3: Normal interspace.  C3-4: Spondylosis with endplate osteophytes and bulging of the disc. Mild narrowing of the ventral subarachnoid space but no compression of the cord. Foraminal encroachment bilaterally that could affect either or both C4 nerve roots.  C4-5: Mild bulging of the disc. No canal or foraminal stenosis.  C5-6: Mild bulging of the disc. No canal or foraminal stenosis.  C6-7: Mild bulging of the disc. No canal or foraminal stenosis.  C7-T1: Normal interspace.  No abnormal cord signal.  IMPRESSION: Chronic degenerative spondylosis at C3-4. No central canal stenosis or cord compromise. Foraminal encroachment bilaterally by osteophytes that would have some potential to affect either or both C4 nerve roots.  He reports that he has never smoked. He has never used smokeless tobacco. No results for input(s): HGBA1C, LABURIC in the last 8760 hours.  Objective:  VS:  HT:    WT:   BMI:     BP:128/82  HR:75bpm  TEMP: ( )  RESP:  Physical Exam  Constitutional: He is oriented to person, place, and time. He appears well-developed and well-nourished. No distress.  HENT:  Head: Normocephalic and atraumatic.  Nose: Nose normal.  Mouth/Throat: Oropharynx is clear and moist.  Eyes: Conjunctivae are normal. Pupils are equal, round, and reactive to light.  Neck: Neck supple. No thyromegaly  present.  Cervical range of motion is limited in ranges pain. He sits with a forward flexed spine does have pain with extension more than forward flexion.  Cardiovascular: Regular rhythm and intact distal pulses.   Pulmonary/Chest: Effort normal and breath sounds normal.  Abdominal: Soft. He exhibits no distension.  Musculoskeletal: He exhibits no deformity.  Patient has focal trigger points in the levator scapula and trapezius rhomboid and teres minor right more than left but essentially midline across. He has some mild shoulder impingement sign on the right compared to left but really good in range motion. He has his strength in the upper extremities bilaterally. He has a negative Hoffmann's test bilaterally. Reflexes are symmetrically diminished and likely just a matter of increased tension.  Lymphadenopathy:    He has no cervical adenopathy.  Neurological: He is alert and oriented to person, place, and time.  Skin: Skin is warm. No rash noted.  Psychiatric: He has a normal mood and affect. His behavior is normal.  Nursing note and vitals reviewed.   Ortho Exam Imaging: No results found.  Past Medical/Family/Surgical/Social  History: Medications & Allergies reviewed per EMR Patient Active Problem List   Diagnosis Date Noted  . Chest pain 06/12/2013  . Esophageal reflux 06/11/2013  . Mixed hyperlipidemia 06/11/2013  . Elevated blood pressure reading without diagnosis of hypertension 06/11/2013   Past Medical History:  Diagnosis Date  . Hypertension   . Migraine   . Multiple sclerosis (HCC)    History reviewed. No pertinent family history. Past Surgical History:  Procedure Laterality Date  . BACK SURGERY    . FOOT SURGERY    . I&D EXTREMITY Left 08/04/2016   Procedure: IRRIGATION AND DEBRIDEMENT EXTREMITY LEFT HAND;  Surgeon: Knute Neu, MD;  Location: [facility] OR;  Service: Plastics;  Laterality: Left;   Social History   Occupational History  . Not on file.   Social  History Main Topics  . Smoking status: Never Smoker  . Smokeless tobacco: Never Used  . Alcohol use No  . Drug use: No  . Sexual activity: Not on file

## 2017-01-04 DIAGNOSIS — M47816 Spondylosis without myelopathy or radiculopathy, lumbar region: Secondary | ICD-10-CM | POA: Diagnosis not present

## 2017-01-04 DIAGNOSIS — Z79891 Long term (current) use of opiate analgesic: Secondary | ICD-10-CM | POA: Diagnosis not present

## 2017-01-04 DIAGNOSIS — M961 Postlaminectomy syndrome, not elsewhere classified: Secondary | ICD-10-CM | POA: Diagnosis not present

## 2017-01-04 DIAGNOSIS — G894 Chronic pain syndrome: Secondary | ICD-10-CM | POA: Diagnosis not present

## 2017-03-23 IMAGING — US US ABDOMEN COMPLETE
1 series · 14 of 25 positions shown · non-contrast
Comparison: 01/19/2005

CLINICAL DATA: Right upper quadrant pain

EXAM:
ULTRASOUND ABDOMEN COMPLETE

[Series 1: us abdomen complete · 0.21mm/px · 14 of 127 slices shown]
[im 1/127]
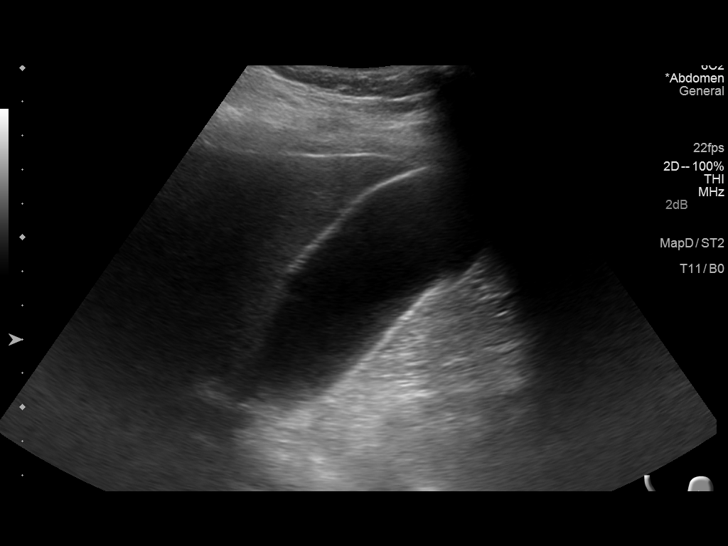
[im 11/127]
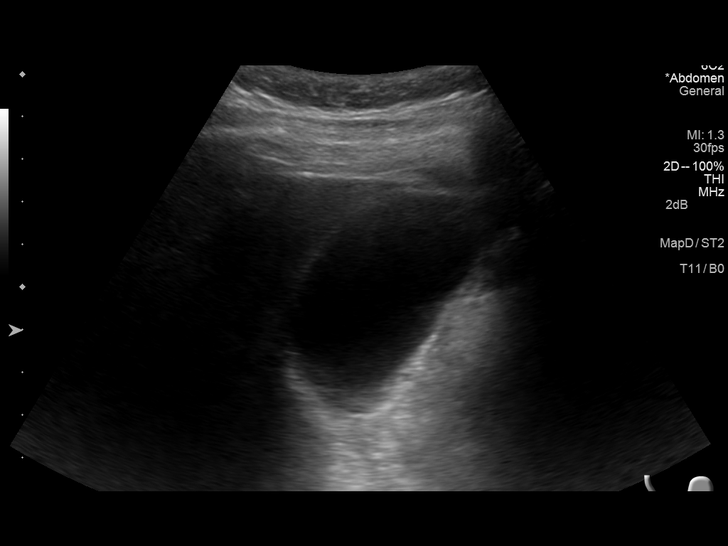
[im 22/127]
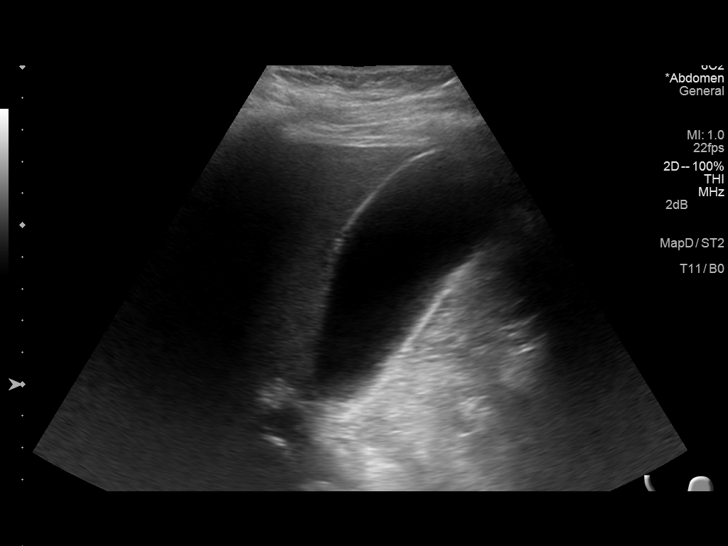
[im 32/127]
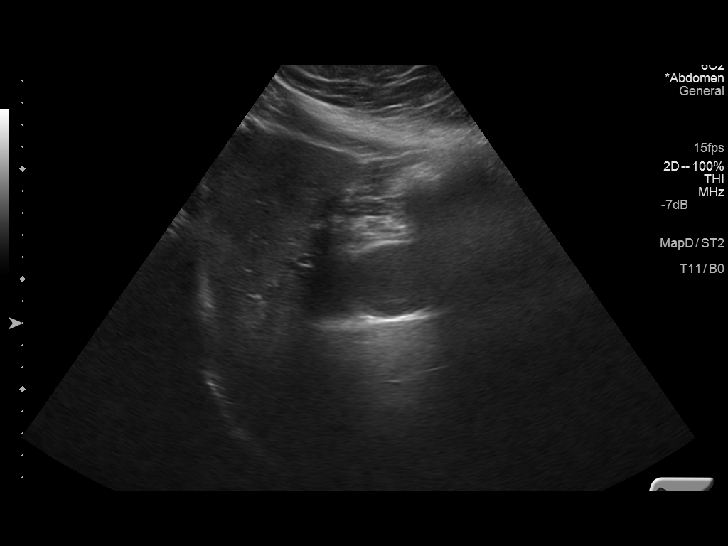
[im 43/127]
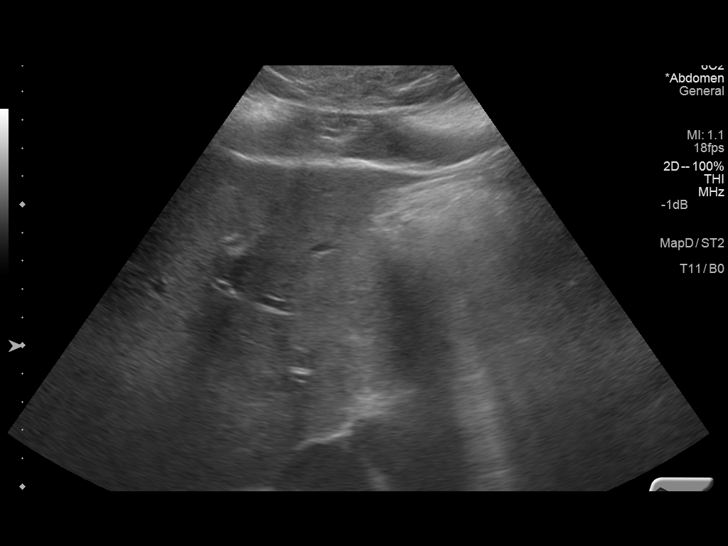
[im 48/127]
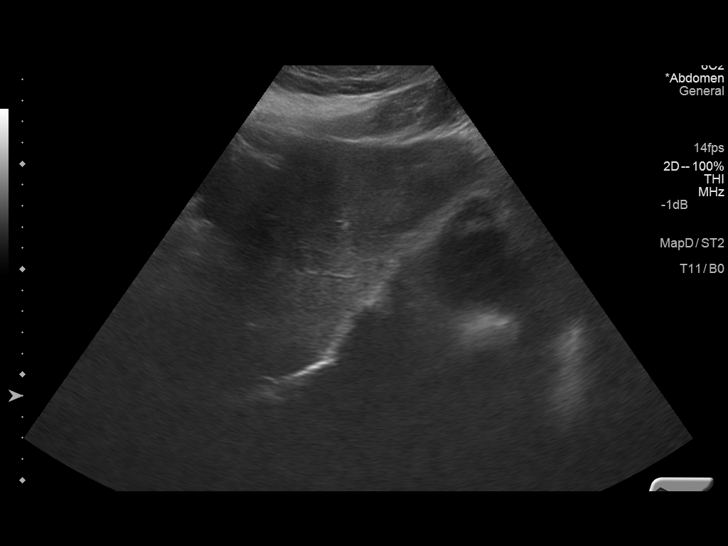
[im 58/127]
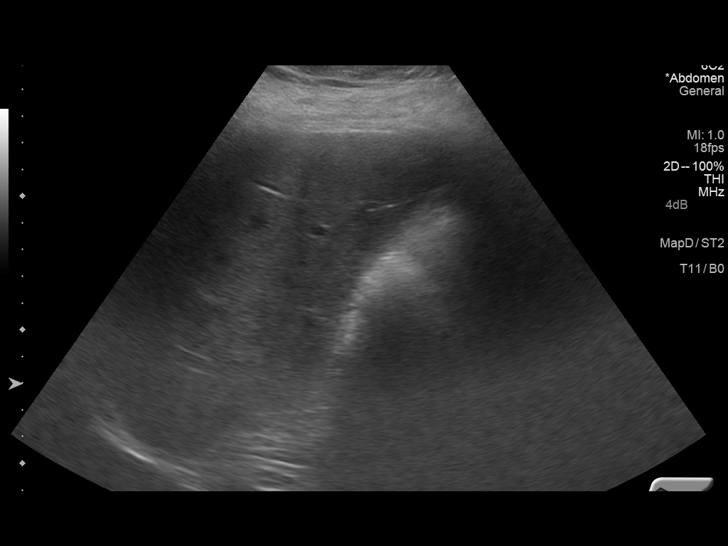
[im 69/127]
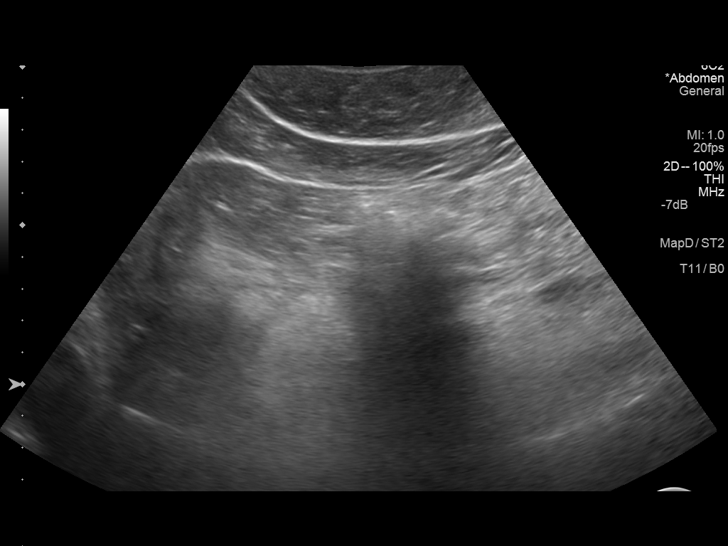
[im 79/127]
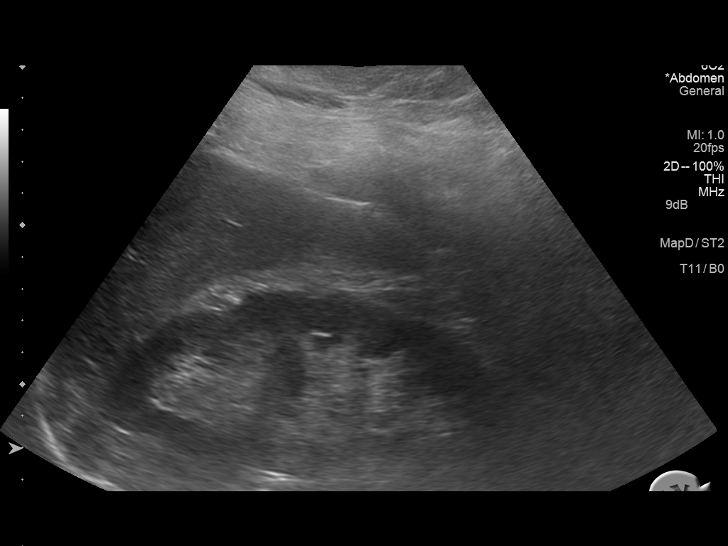
[im 85/127]
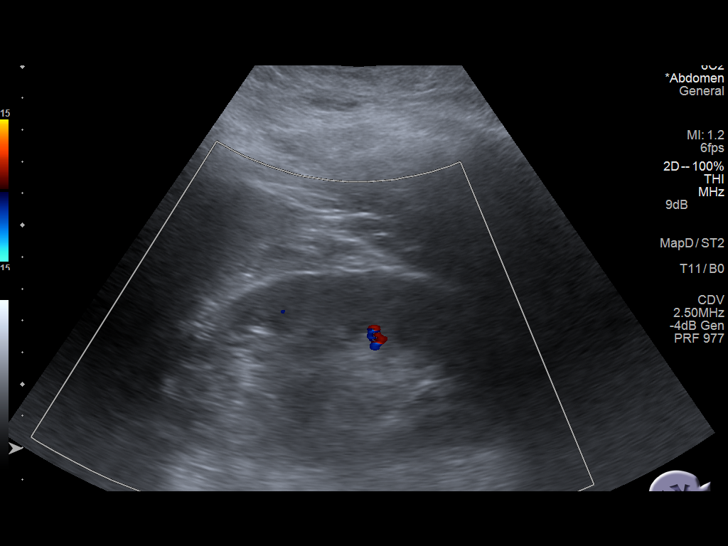
[im 95/127]
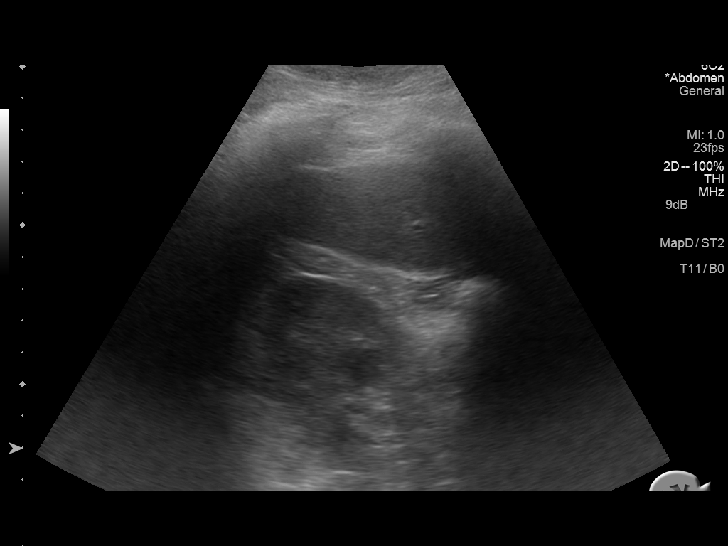
[im 106/127]
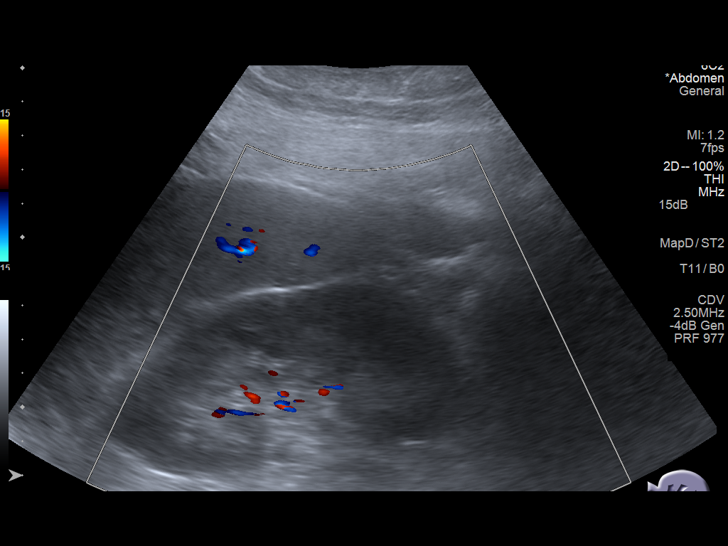
[im 116/127]
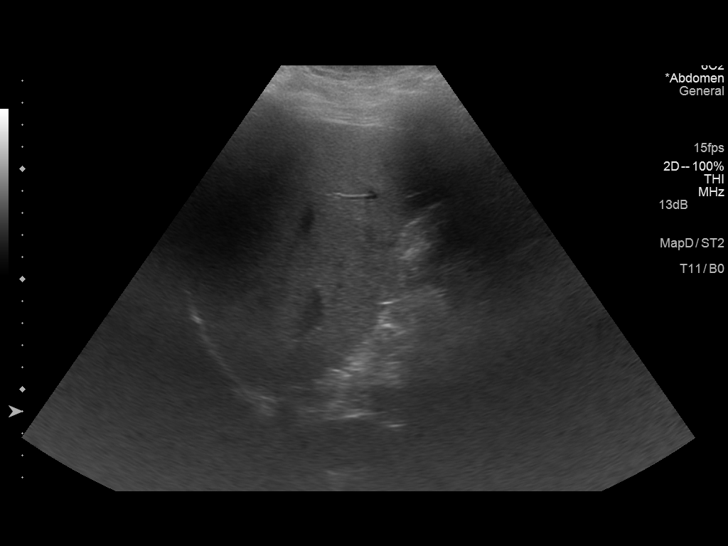
[im 127/127]
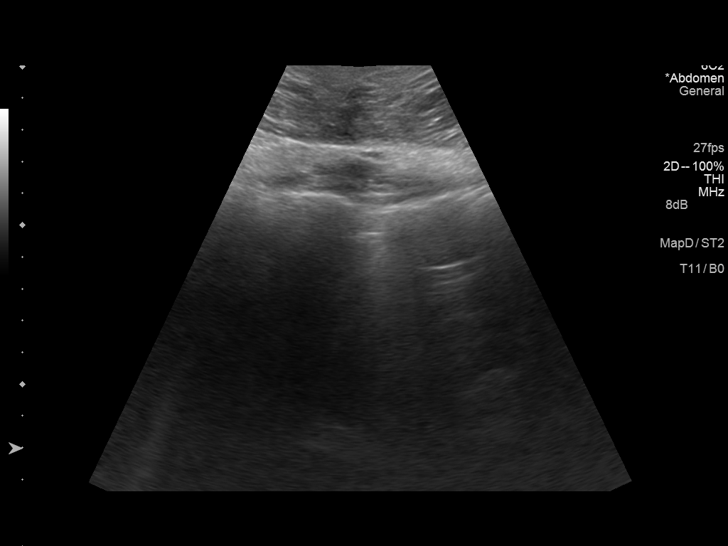

[14 of 25 positions shown; findings below may reference images not displayed]

FINDINGS: Gallbladder: No gallstones or wall thickening visualized. No
sonographic Murphy sign noted.

Common bile duct: Diameter: 2.7 mm.

Liver: No focal lesion identified. Within normal limits in
parenchymal echogenicity.

IVC: No abnormality visualized.

Pancreas: Obscured by overlying bowel gas.

Spleen: Size and appearance within normal limits.

Right Kidney: Length: 13.3 cm.. No mass or hydronephrosis is noted.
Some echogenicities are noted which may represent vascular
calcifications or nonobstructing stones.

Left Kidney: Length: 12.4 cm.. Echogenicity within normal limits. No
mass or hydronephrosis visualized.

Abdominal aorta: No aneurysm visualized.

Other findings: None.
IMPRESSION: Small echogenicities within the right kidney will representing
vascular calcifications or nonobstructing stones. These were not
well appreciated on the prior exam.

No other focal abnormality is noted.

## 2017-05-13 IMAGING — MR MR CERVICAL SPINE W/O CM
4 of 5 series · 22 of 48 positions shown · non-contrast
Comparison: None.

CLINICAL DATA: Bilateral arm and shoulder pain, 2 months duration.

EXAM:
MRI CERVICAL SPINE WITHOUT CONTRAST
TECHNIQUE: Multiplanar, multisequence MR imaging of the cervical spine was
performed. No intravenous contrast was administered.

[Series 2: T2 · sagittal · 3.0mm · 0.41mm/px · 6 of 12 slices shown (1 of 3)]
[im 1/12]
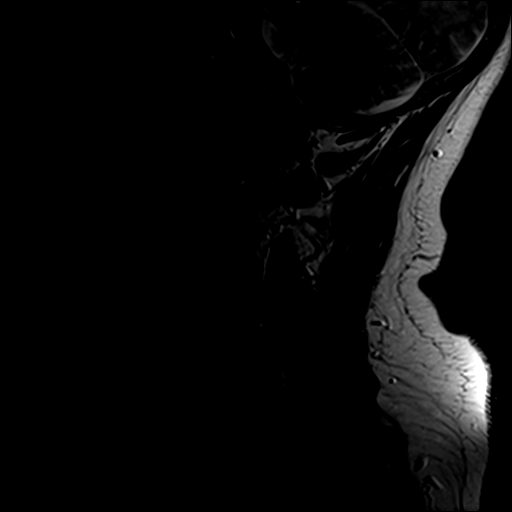
[im 3/12]
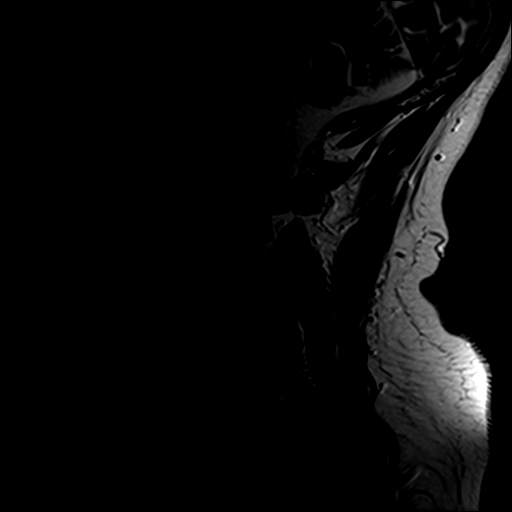
[im 5/12]
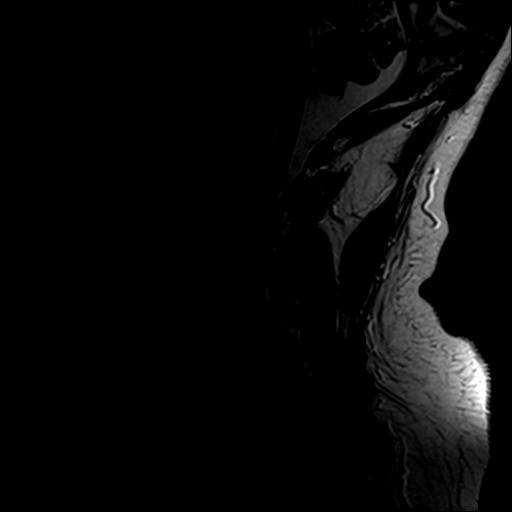
[im 7/12]
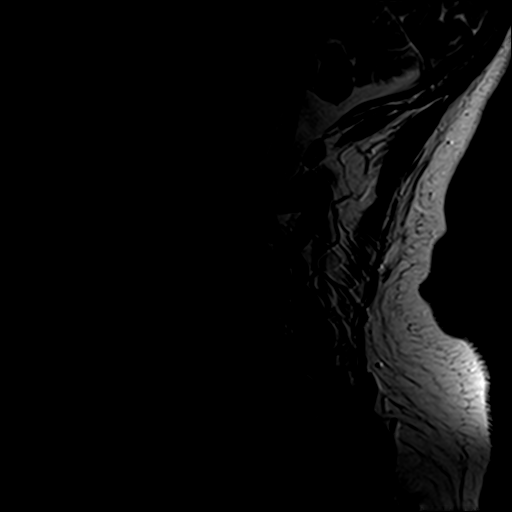
[im 9/12]
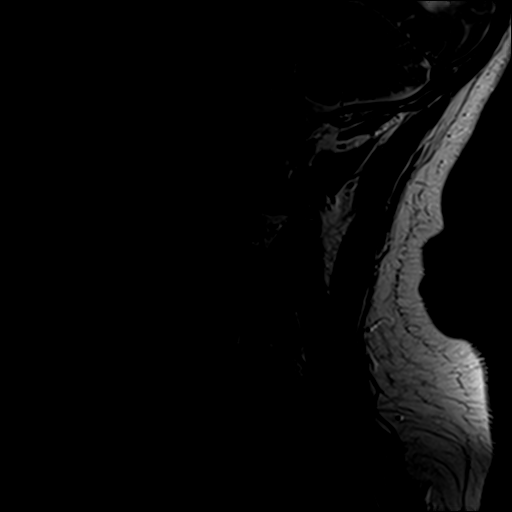
[im 12/12]
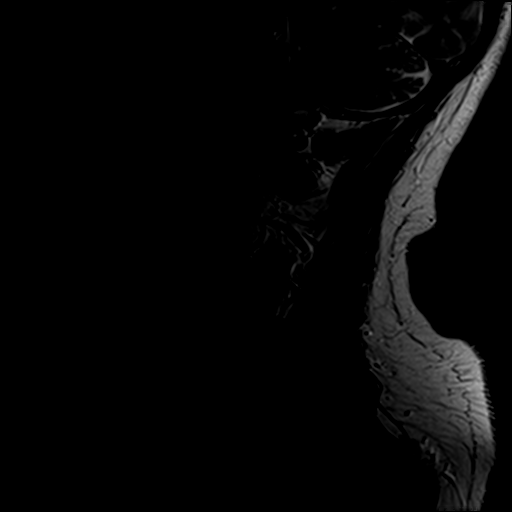

[Series 3: T1 · sagittal · 3.0mm · 0.41mm/px · 3 of 12 slices shown]
[im 3/12]
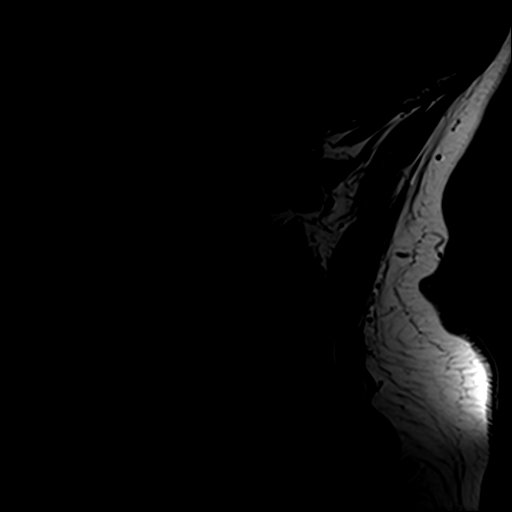
[im 7/12]
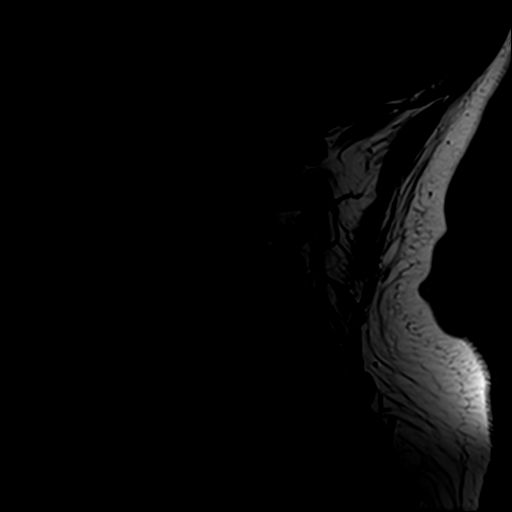
[im 12/12]
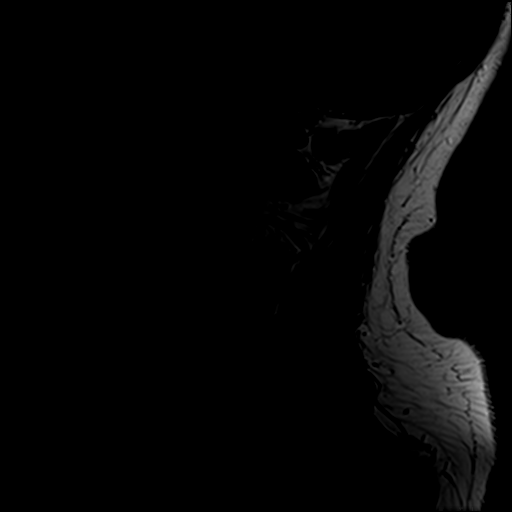

[Series 5: T2 · axial · 3.0mm · 0.39mm/px · z∈[-40,+61]mm · 9 of 28 slices shown (2 of 3)]
[im 1/28]
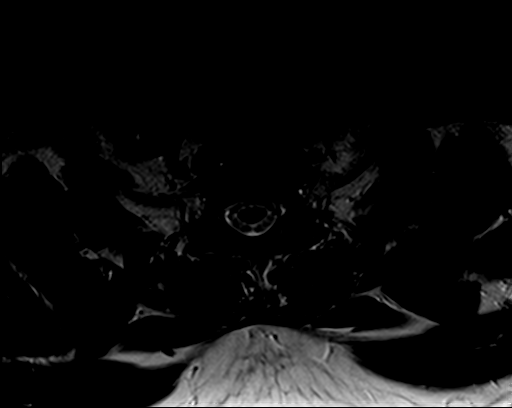
[im 4/28]
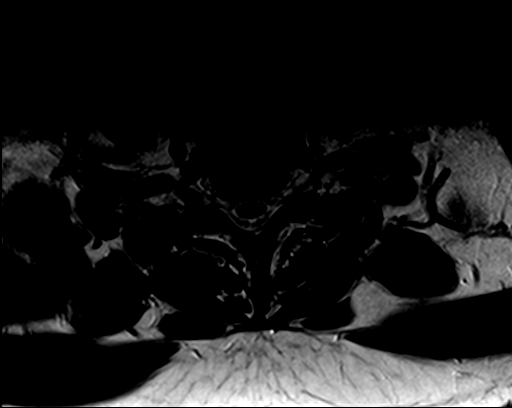
[im 8/28]
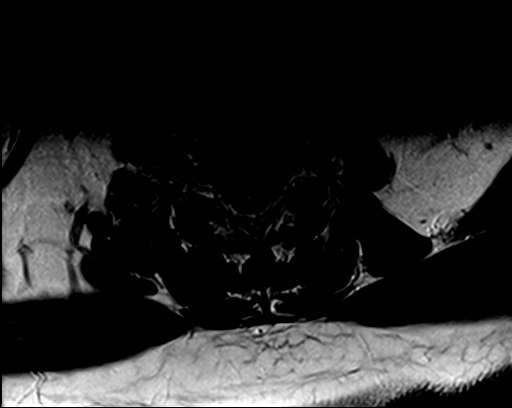
[im 12/28]
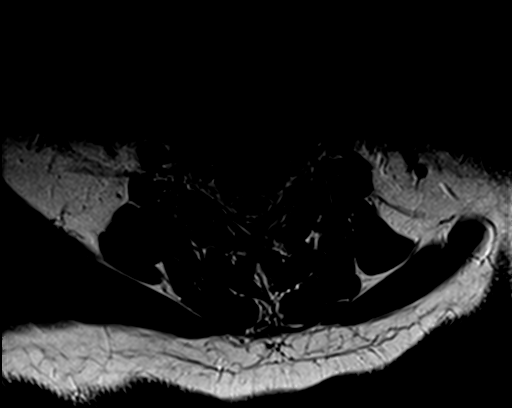
[im 14/28]
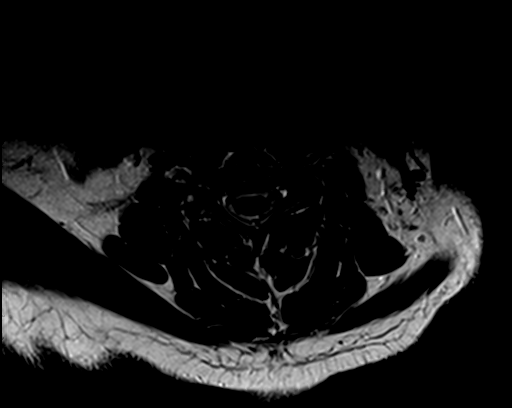
[im 16/28]
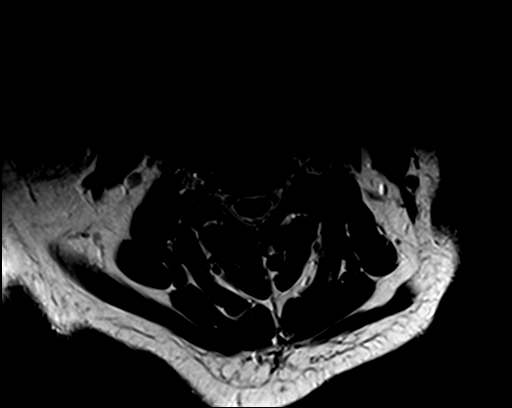
[im 20/28]
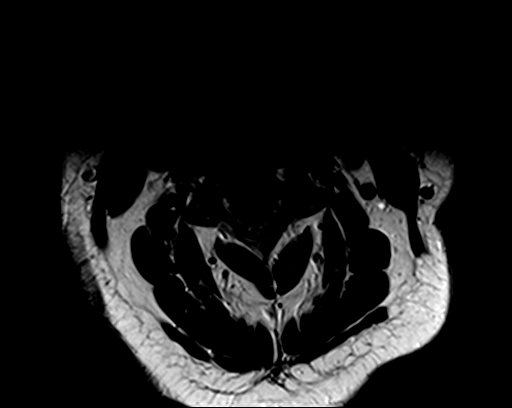
[im 24/28]
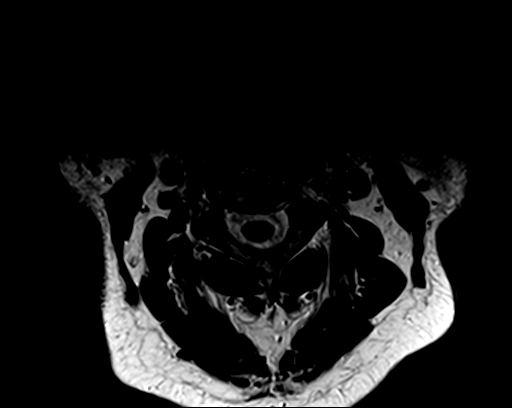
[im 28/28]
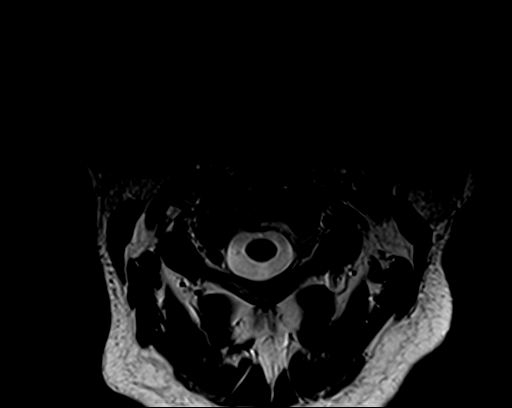

[Series 6: T2 · axial · 3.0mm · 0.39mm/px · z∈[-40,+46]mm · 4 of 28 slices shown (3 of 3)]
[im 1/28]
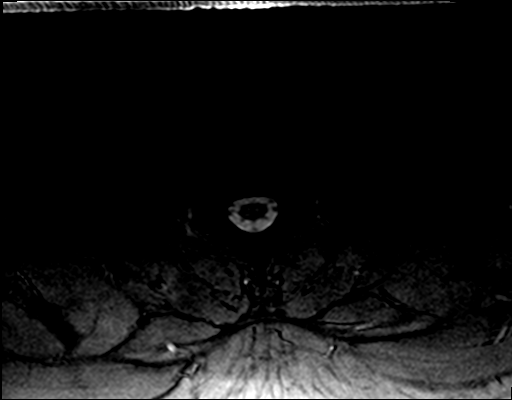
[im 4/28]
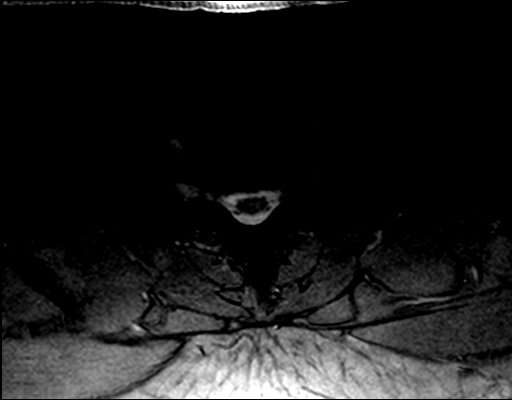
[im 14/28]
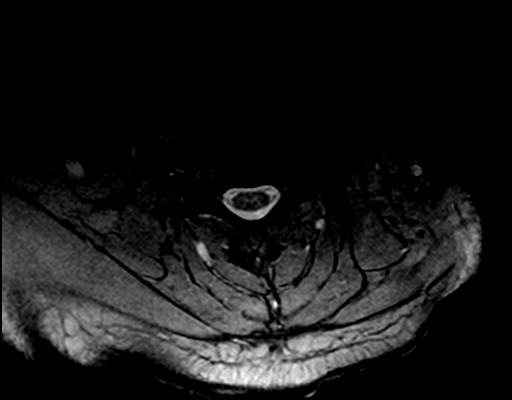
[im 24/28]
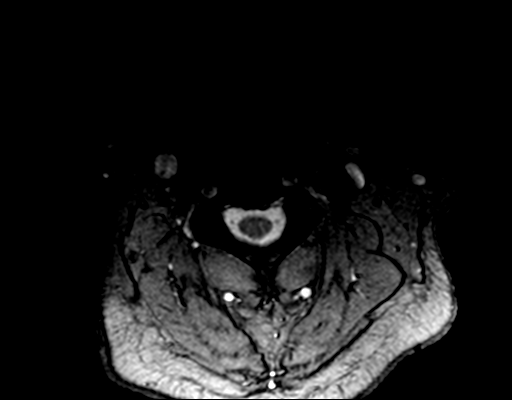

[22 of 48 positions shown; findings below may reference images not displayed]

FINDINGS: The foramen magnum is widely patent. There is ordinary mild
osteoarthritis at the C1-2 articulation but no encroachment upon the
neural spaces.

C2-3:  Normal interspace.

C3-4: Spondylosis with endplate osteophytes and bulging of the disc.
Mild narrowing of the ventral subarachnoid space but no compression
of the cord. Foraminal encroachment bilaterally that could affect
either or both C4 nerve roots.

C4-5:  Mild bulging of the disc.  No canal or foraminal stenosis.

C5-6: Mild bulging of the disc.  No canal or foraminal stenosis.

C6-7:  Mild bulging of the disc.  No canal or foraminal stenosis.

C7-T1:  Normal interspace.

No abnormal cord signal.
IMPRESSION: Chronic degenerative spondylosis at C3-4. No central canal stenosis
or cord compromise. Foraminal encroachment bilaterally by
osteophytes that would have some potential to affect either or both
C4 nerve roots.

## 2017-07-25 ENCOUNTER — Other Ambulatory Visit: Payer: Self-pay | Admitting: Psychiatry

## 2017-07-25 DIAGNOSIS — G35 Multiple sclerosis: Secondary | ICD-10-CM

## 2017-08-05 ENCOUNTER — Other Ambulatory Visit: Payer: BC Managed Care – PPO

## 2017-08-16 ENCOUNTER — Ambulatory Visit
Admission: RE | Admit: 2017-08-16 | Discharge: 2017-08-16 | Disposition: A | Discharge status: Home / Self Care | Payer: Medicare Other | Source: Ambulatory Visit | Attending: Psychiatry | Admitting: Psychiatry

## 2017-08-16 DIAGNOSIS — G35 Multiple sclerosis: Secondary | ICD-10-CM

## 2017-08-16 MED ORDER — GADOBENATE DIMEGLUMINE 529 MG/ML IV SOLN: 20.0000 mL | Freq: Once | INTRAVENOUS | Status: DC | PRN

## 2018-02-03 ENCOUNTER — Encounter (HOSPITAL_COMMUNITY): Payer: Medicare Other

## 2018-02-11 ENCOUNTER — Encounter: Payer: Self-pay | Admitting: Neurology

## 2018-02-11 ENCOUNTER — Ambulatory Visit: Payer: Medicare Other | Admitting: Neurology

## 2018-02-11 ENCOUNTER — Other Ambulatory Visit: Payer: Self-pay

## 2018-02-11 ENCOUNTER — Encounter: Payer: Self-pay | Admitting: Psychology

## 2018-02-11 VITALS — BP 139/80 | HR 74 | Resp 18 | Ht 71.0 in | Wt 208.5 lb

## 2018-02-11 DIAGNOSIS — G35 Multiple sclerosis: Secondary | ICD-10-CM | POA: Diagnosis not present

## 2018-02-11 DIAGNOSIS — F988 Other specified behavioral and emotional disorders with onset usually occurring in childhood and adolescence: Secondary | ICD-10-CM

## 2018-02-11 DIAGNOSIS — R9082 White matter disease, unspecified: Secondary | ICD-10-CM | POA: Diagnosis not present

## 2018-02-11 DIAGNOSIS — R4189 Other symptoms and signs involving cognitive functions and awareness: Secondary | ICD-10-CM | POA: Diagnosis not present

## 2018-02-11 DIAGNOSIS — L4050 Arthropathic psoriasis, unspecified: Secondary | ICD-10-CM

## 2018-02-11 DIAGNOSIS — M791 Myalgia, unspecified site: Secondary | ICD-10-CM

## 2018-02-11 DIAGNOSIS — R5383 Other fatigue: Secondary | ICD-10-CM | POA: Diagnosis not present

## 2018-02-11 MED ORDER — AMPHETAMINE-DEXTROAMPHET ER 10 MG PO CP24: 10.0000 mg | ORAL_CAPSULE | Freq: Every day | ORAL | 0 refills | Status: DC

## 2018-02-11 MED ORDER — GABAPENTIN 600 MG PO TABS: 600.0000 mg | ORAL_TABLET | Freq: Three times a day (TID) | ORAL | 5 refills | Status: DC

## 2018-02-11 NOTE — Progress Notes (Signed)
[facility]  PATIENT: Paul Pope DOB: 1956/01/27  REFERRING DOCTOR OR PCP:  Benita Stabile SOURCE: Patient, notes from Dr. Margo Aye, multiple laboratory and MRI reports, multiple MRIs on PACS personally reviewed.  _________________________________   HISTORICAL  CHIEF COMPLAINT:  Chief Complaint  Patient presents with  . Multiple Sclerosis    Paul Pope is here with his wife Jearld Fenton to trransfer care of MS to Dr. Epimenio Foot.  Dx. 2002.  Presenting sx. was spasticity/spasms in legs, torso, fatigue, h/a's. He had an MRI brain to investigate h/a's, and MS lesions were seen.  He saw Dr. Anne Hahn here in our office, who referred him to Dr. Leotis Shames at [facility].  Sts. he had further testing over the next year, and was dx. in 2002.  Initially started Betaseron, but stopped after several wks due to intolerance (depression).  Next tried Rebif, but stopped after   . Fatigue    several wks. due to intolerance.  Next tried Copaxone, which he was on for 5-6 yrs., but stopped when he began having tachycardia with injections.  Started Gilenya in 2012, which he tolerated well.  He began having worsening of depression, anxiety, attention, focus, and sts. Dr. Tinnie Gens felt he would do better on Ocrevus.  He stopped Gilenya in March 2019 and is scheduled for part A of the first Ocrevus infusion on 02/18/18, at [facility].  He would like to discuss tx. options with Dr. Epimenio Foot, to   . Depression/Anxiety    help decideif he would like to proceed with Ocrevus/fim    HISTORY OF PRESENT ILLNESS:  I had the pleasure seeing your patient, Paul Pope, at the MS center at [facility] for neurologic consultation regarding his diagnosis of MS and disease modifying therapy options.  He began to experience migraines in the 1990's.   He was diagnosed with MS in 2002 after presenting with spasticity in his upper back and legs after heavy activity.    He could not get his muscles to relax in his  neck.   He also had psoriatic arthritis.   He had an MRI of the brain showing white matter foci.    He was referred to Dr. Anne Hahn.   He had an LP around 2002.   He believes it did not show any bands.  Dr. Anne Hahn sent him to Dr. Leotis Shames for a second opinion.    He was placed on Betaseron, then Rebif but had trouble tolerating them.   He started Copaxone and tolerated it well.   In 2012, an MRI showed some progression and he was switched to Gilenya.   He noted more bad days then good days and decided to switch medications.  He stopped Gilenya March 2019.   He feels the same off Gilenya as he did on the medication.    Ocrevus has been recommended and he is scheduled to hava his first split dose in a couple weeks.   He is not certain he wants to start Ocrevus.      He walks well most days but he feels weaker as he walks longer distances.   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    Strength is good.     He gets pain in his legs but has no limb numbness.  There is some burning in his arms.  Sometimes the left face feels numb.    He is on gabapentin 300 mg po qid with benefit.   He notes muscle  spasticity and does better on baclofen 10 mg 3-4 times a day.   A higher dose causes headache.    He was once on tizanidine but does not recall whether it helped.  He has urinary hesitancy since the 1980's.   He has been told the prostate was fine.    He was diagnosed with a 'shy bladder'.   Flomax had not helped.       His legs fatigue very easily, especially in hot weather.   He sleeps 6 hours or so most nights.   SOmetimes he gets hot flashes.   He snores some but his wife does not note any OSA like sounds or pauses.  He has a long history of depression and is on Pristiq 100 mg.   He tried Cymbalta and did better for a while but then the benefit stopped.      Cognitively, he notes poor focus and attention.         He has a lot of pain in his back and legs.   He takes Oxycodone 7.5 mg 4 times a day.    He has had  lumbar surgery in the past by Dr. Otelia Sergeant in 2004 (either L4L5 or L5S1).    I personally reviewed the following imaging studies: MRI brain 08/16/2017:    Multiple T2/FLAIR hyperintense foci in the hemispheres, predominantly in the subcortical and deep white matter.  There are no classic periventricular foci.  No foci are noted in the infratentorial white matter.  Normal enhancement pattern.    Compared to the MRI dated 09/05/2010, minimal interval change. MRI cervical 01/21/2015:   Spinal cord is normal.  Mild multilevel degenerative changes with no nerve root compression. MRI brain 09/05/2010:    Multiple T2/FLAIR hyperintense foci in the hemispheres, predominantly in the subcortical and deep white matter.  There are no classic periventricular foci.  No foci are noted in the infratentorial white matter.  Normal enhancement pattern.   Very Mild progression compared to 2003 MRI brain 03/03/2002:   Multiple T2/FLAIR hyperintense foci in the hemispheres, predominantly in the subcortical and deep white matter.  There are no classic periventricular foci.  No foci are noted in the infratentorial white matter.  Normal enhancement pattern.  Vascular risks:   Does not smoke, has benign essential hypertension, is not diabetic, no cardiac history.     REVIEW OF SYSTEMS: Constitutional: No fevers, chills, sweats, or change in appetite Eyes: No visual changes, double vision, eye pain Ear, nose and throat: No hearing loss, ear pain, nasal congestion, sore throat Cardiovascular: No chest pain, palpitations Respiratory: No shortness of breath at rest or with exertion.   No wheezes GastrointestinaI: No nausea, vomiting, diarrhea, abdominal pain, fecal incontinence Genitourinary: No dysuria, urinary retention or frequency.  No nocturia. Musculoskeletal: No neck pain, back pain Integumentary: No rash, pruritus, skin lesions Neurological: as above Psychiatric: No depression at this time.  No anxiety Endocrine: No  palpitations, diaphoresis, change in appetite, change in weigh or increased thirst Hematologic/Lymphatic: No anemia, purpura, petechiae. Allergic/Immunologic: No itchy/runny eyes, nasal congestion, recent allergic reactions, rashes  ALLERGIES: Allergies  Allergen Reactions  . Sulfa Antibiotics     HOME MEDICATIONS:  Current Outpatient Medications:  .  acidophilus (RISAQUAD) CAPS capsule, Take 1 capsule by mouth daily., Disp: , Rfl:  .  aspirin EC 81 MG tablet, Take 81 mg by mouth daily., Disp: , Rfl:  .  baclofen (LIORESAL) 20 MG tablet, Take 20 mg by mouth 4 (four) times daily  as needed for muscle spasms. , Disp: , Rfl:  .  cholecalciferol (VITAMIN D) 1000 units tablet, Take 3,000 Units by mouth daily., Disp: , Rfl:  .  desvenlafaxine (PRISTIQ) 100 MG 24 hr tablet, , Disp: , Rfl:  .  gabapentin (NEURONTIN) 300 MG capsule, Take 300 mg by mouth 4 (four) times daily. , Disp: , Rfl:  .  lisinopril (PRINIVIL,ZESTRIL) 10 MG tablet, Take 1 tablet by mouth daily., Disp: , Rfl:  .  oxyCODONE-acetaminophen (PERCOCET) 7.5-325 MG per tablet, Take 1 tablet by mouth 4 (four) times daily. , Disp: , Rfl:  .  polyethylene glycol (MIRALAX / GLYCOLAX) packet, Take 17 g by mouth daily as needed (constipation)., Disp: , Rfl:  .  escitalopram (LEXAPRO) 20 MG tablet, Take 20 mg by mouth daily., Disp: , Rfl:   PAST MEDICAL HISTORY: Past Medical History:  Diagnosis Date  . Hypertension   . Kidney stones   . Migraine   . Multiple sclerosis (HCC)   . Vision abnormalities     PAST SURGICAL HISTORY: Past Surgical History:  Procedure Laterality Date  . BACK SURGERY    . FOOT SURGERY    . I&D EXTREMITY Left 08/04/2016   Procedure: IRRIGATION AND DEBRIDEMENT EXTREMITY LEFT HAND;  Surgeon: Knute Neu, MD;  Location: [facility] OR;  Service: Plastics;  Laterality: Left;    FAMILY HISTORY: Family History  Problem Relation Age of Onset  . Atrial fibrillation Mother   . Dementia Mother   . Heart disease  Mother   . Lung cancer Father     SOCIAL HISTORY:  Social History   Socioeconomic History  . Marital status: Married    Spouse name: Not on file  . Number of children: Not on file  . Years of education: Not on file  . Highest education level: Not on file  Occupational History  . Not on file  Social Needs  . Financial resource strain: Not on file  . Food insecurity:    Worry: Not on file    Inability: Not on file  . Transportation needs:    Medical: Not on file    Non-medical: Not on file  Tobacco Use  . Smoking status: Never Smoker  . Smokeless tobacco: Never Used  Substance and Sexual Activity  . Alcohol use: No  . Drug use: No  . Sexual activity: Not on file  Lifestyle  . Physical activity:    Days per week: Not on file    Minutes per session: Not on file  . Stress: Not on file  Relationships  . Social connections:    Talks on phone: Not on file    Gets together: Not on file    Attends religious service: Not on file    Active member of club or organization: Not on file    Attends meetings of clubs or organizations: Not on file    Relationship status: Not on file  . Intimate partner violence:    Fear of current or ex partner: Not on file    Emotionally abused: Not on file    Physically abused: Not on file    Forced sexual activity: Not on file  Other Topics Concern  . Not on file  Social History Narrative  . Not on file     PHYSICAL EXAM  Vitals:   02/11/18 0848  BP: 139/80  Pulse: 74  Resp: 18  Weight: 208 lb 8 oz (94.6 kg)  Height: 5\' 11"  (1.803 m)    Body  mass index is 29.08 kg/m.   General: The patient is well-developed and well-nourished and in no acute distress  Eyes:  Funduscopic exam shows normal optic discs and retinal vessels.  Neck: The neck is supple, no carotid bruits are noted.  He has tenderness over the cervical paraspinal muscles.  Cardiovascular: The heart has a regular rate and rhythm with a normal S1 and S2. There were  no murmurs, gallops or rubs. Lungs are clear to auscultation.  Ext: Extremities are without rash or edema.  Tender in most of the proximal muscles to deep palpation  Musculoskeletal:  Back is tender  Neurologic Exam  Mental status: The patient is alert and oriented x 3 at the time of the examination. The patient has apparent normal recent and remote memory, with mildly reduced attention span and mild word finding difficulties..  Cranial nerves: Extraocular movements are full. Pupils are equal, round, and reactive to light and accomodation.  Visual fields are full.  Facial symmetry is present. There is good facial sensation to soft touch bilaterally.Facial strength is normal.  Trapezius and sternocleidomastoid strength is normal. No dysarthria is noted.  The tongue is midline, and the patient has symmetric elevation of the soft palate. No obvious hearing deficits are noted.  Motor:  Muscle bulk is normal.   Tone is normal. Strength is  5 / 5 in all 4 extremities.   Sensory: Sensory testing is intact to pinprick, soft touch and vibration sensation in all 4 extremities.  Coordination: Cerebellar testing reveals good finger-nose-finger and heel-to-shin bilaterally.  Gait and station: Station is normal.   The gait is arthritic and the tandem gait is mildly wide.. Romberg is negative.   Reflexes: Deep tendon reflexes are symmetric and normal bilaterally.   Plantar responses are flexor.    DIAGNOSTIC DATA (LABS, IMAGING, TESTING) - I reviewed patient records, labs, notes, testing and imaging myself where available.  Lab Results  Component Value Date   WBC 6.1 08/31/2014   HGB 15.4 08/31/2014   HCT 44.3 08/31/2014   MCV 87.5 08/31/2014   PLT 214 08/31/2014      Component Value Date/Time   NA 140 08/31/2014 1120   K 3.7 08/31/2014 1120   CL 103 08/31/2014 1120   CO2 28 08/31/2014 1120   GLUCOSE 121 (H) 08/31/2014 1120   BUN 21 08/31/2014 1120   CREATININE 0.80 08/31/2014 1120    CALCIUM 9.7 08/31/2014 1120   GFRNONAA >90 08/31/2014 1120   GFRAA >90 08/31/2014 1120       ASSESSMENT AND PLAN  Multiple sclerosis (HCC) - Plan: Ambulatory referral to Neuropsychology  Cognitive decline - Plan: Ambulatory referral to Neuropsychology  Attention deficit disorder, unspecified hyperactivity presence - Plan: Ambulatory referral to Neuropsychology  Other fatigue - Plan: ECHOCARDIOGRAM COMPLETE BUBBLE STUDY  White matter abnormality on MRI of brain - Plan: ECHOCARDIOGRAM COMPLETE BUBBLE STUDY  Myalgia  Psoriatic arthritis (HCC) - Plan: Ambulatory referral to Rheumatology   In summary, Mr. Swoboda is a 62 year old man who was diagnosed with multiple sclerosis around 2002.   His most significant symptoms have always been muscle spasms, pain and fatigue and more recently cognitive difficulties.  Additionally he has depression.  In the past he was diagnosed with psoriatic arthritis.  Reviewing the MRIs, he has a pattern of mostly nonspecific foci in the brain.  Although compatible with MS, the MRI changes could also be due to chronic microvascular ischemic change, changes from migraine, sequela of cardioemboli.   We will try to  do the lumbar puncture results but they are from 2002 before the EMR and this may not be possible.  He will hold off any disease modifying therapy for the time being as we try to get additional data.  We will check an echocardiogram with bubble contrast to rule out a PFO/ASD.  W we will ould also request a referral to neuropsychology for neurocognitive testing as he has some cognitive issues.  He has seen rheumatology in the past and will return for an opinion regarding the possibility that some of his pain could be related to psoriatic arthritis, a diagnosis he had in the past.  If this is his diagnosis, consider either leflunomide or teriflunomide though we could also consider Rituxan/Ocrevus.  To help with some of his symptoms he will increase the gabapentin  dose to 600 mg 3 or 4 times a day and Adderall XR 10 mg will be prescribed.  He will return to see me in 3 to 4 months or sooner if there are new or worsening neurologic symptoms.  Thank you for asking me to see Mr. Pacitti.  Please let me know if I can be of further assistance with him or other patients in the future.  On top of a 60-minute face-to-face evaluation and comprehensive new patient visit, an additional 35 minutes was spent reviewing extensive records and multiple MRI images were personally reviewed on CD PACS.  Richard A. Epimenio Foot, MD, [facility] 02/11/2018, 9:09 AM Certified in Neurology, Clinical Neurophysiology, Sleep Medicine, Pain Medicine and Neuroimaging  [facility] [address], [address] [place], [place] [address] [phone]

## 2018-02-13 ENCOUNTER — Telehealth: Payer: Self-pay | Admitting: Neurology

## 2018-02-13 NOTE — Telephone Encounter (Signed)
Med list updated per pt's instruction/fim

## 2018-02-13 NOTE — Telephone Encounter (Signed)
Pt called stating he wrote down the wrong dosing for gabapentin stating he currently takes 3600 MG daily. FYI

## 2018-02-18 ENCOUNTER — Inpatient Hospital Stay (HOSPITAL_COMMUNITY): Admission: RE | Admit: 2018-02-18 | Payer: Medicare Other | Source: Ambulatory Visit

## 2018-02-19 ENCOUNTER — Ambulatory Visit (HOSPITAL_COMMUNITY)
Admission: RE | Admit: 2018-02-19 | Discharge: 2018-02-19 | Disposition: A | Discharge status: Home / Self Care | Payer: Medicare Other | Source: Ambulatory Visit | Attending: Neurology | Admitting: Neurology

## 2018-02-19 DIAGNOSIS — R030 Elevated blood-pressure reading, without diagnosis of hypertension: Secondary | ICD-10-CM | POA: Insufficient documentation

## 2018-02-19 DIAGNOSIS — E782 Mixed hyperlipidemia: Secondary | ICD-10-CM | POA: Diagnosis not present

## 2018-02-19 DIAGNOSIS — R5383 Other fatigue: Secondary | ICD-10-CM | POA: Diagnosis present

## 2018-02-19 DIAGNOSIS — R9082 White matter disease, unspecified: Secondary | ICD-10-CM | POA: Diagnosis not present

## 2018-02-19 DIAGNOSIS — K219 Gastro-esophageal reflux disease without esophagitis: Secondary | ICD-10-CM | POA: Diagnosis not present

## 2018-02-19 DIAGNOSIS — G35 Multiple sclerosis: Secondary | ICD-10-CM | POA: Insufficient documentation

## 2018-02-19 NOTE — Progress Notes (Signed)
*  PRELIMINARY RESULTS* Echocardiogram 2D Echocardiogram has been performed with agitated saline contrast study.  Stacey Drain 02/19/2018, 2:33 PM

## 2018-02-24 ENCOUNTER — Telehealth: Payer: Self-pay | Admitting: *Deleted

## 2018-02-24 NOTE — Telephone Encounter (Signed)
-----   Message from Asa Lente, MD sent at 02/20/2018  5:46 PM EDT ----- Please let him know that the echocardiogram looked ok for age, showing that the heart filled slightly slower than typical but not enough to be concerned about.  There were no shunts.

## 2018-02-24 NOTE — Telephone Encounter (Signed)
Spoke with pt. and per RAS, advised he may increase Adderall XR 10mg  to 2 tabs in the am for one week, let us know how this works/fim

## 2018-02-24 NOTE — Telephone Encounter (Signed)
Spoke with Paul Pope and reviewed below echo results.  He verbalized understanding of same.  He sts. that since starting Adderall XR 10mg , he doesn't feel much relief of fatigue, focus/attention problems.  Would like to increase if possible.  I will check with RAS and call him back/fim

## 2018-03-24 ENCOUNTER — Telehealth: Payer: Self-pay | Admitting: *Deleted

## 2018-03-24 MED ORDER — AMPHETAMINE-DEXTROAMPHET ER 10 MG PO CP24: 10.0000 mg | ORAL_CAPSULE | Freq: Every day | ORAL | 0 refills | Status: DC

## 2018-03-24 NOTE — Telephone Encounter (Signed)
x

## 2018-03-25 ENCOUNTER — Other Ambulatory Visit: Payer: Self-pay | Admitting: *Deleted

## 2018-03-25 MED ORDER — BUPROPION HCL ER (XL) 300 MG PO TB24: 300.0000 mg | ORAL_TABLET | Freq: Every day | ORAL | 1 refills | Status: DC

## 2018-03-25 NOTE — Telephone Encounter (Signed)
After speaking with RAS, I called Paul Pope back.  1--stop Adderall since it has not helped.  2--add Wellbutrin XL 300mg  once daily to Pristiq. Rx. has been sent to [facility]. 3--We received notes from [facility] this am.  His lab testing there was nml.  They do not feel he has psoriatic arthritis and do not feel any f/u by their office is needed at this time.  4--Dr. Epimenio Foot will await the results of pt's neuropsych testing (sched. for 9/17) to see if any other tx. is needed. Paul Pope verbalized understanding of all of above/fim

## 2018-03-25 NOTE — Telephone Encounter (Signed)
Pt requesting a call to discuss his adderall dosing. Stating since rasing the dosing to 2 tablets he has felt much of a difference. Please call to advise

## 2018-03-25 NOTE — Telephone Encounter (Signed)
Spoke with Paul Pope.  He sts. he does not believe Adderall XR 10mg , 2 capsules in the am have provided any sig. relief of fatigue or helped focus/concentration. He is not able to take IR Adderall (h/a). Also still taking Pristiq for depression but not sure how much this helps either. Sts. at one point his pcp wanted to add Wellbutrin but he declined this. "Maybe I should have."  Sts. he saw [facility] several wks. ago and had testing there but has not gotten the results.  He will call them today. He has neuropsych testing sched. for 05/06/18/fim

## 2018-03-25 NOTE — Addendum Note (Signed)
Addended by: Candis Schatz I on: 03/25/2018 09:36 AM   Modules accepted: Orders

## 2018-03-27 ENCOUNTER — Ambulatory Visit: Payer: Medicare Other | Admitting: Neurology

## 2018-04-22 ENCOUNTER — Telehealth: Payer: Self-pay | Admitting: Neurology

## 2018-04-22 MED ORDER — DESVENLAFAXINE SUCCINATE ER 100 MG PO TB24: 100.0000 mg | ORAL_TABLET | Freq: Every day | ORAL | 0 refills | Status: DC

## 2018-04-22 NOTE — Telephone Encounter (Signed)
Pt states that after being on buPROPion (WELLBUTRIN XL) 300 MG 24 hr tablet for almost a month he starting having head aches and insomnia.  Pt states when he stopped taking it the head aches and insomnia went away.  Pt is asking is he needs to be weaned off of this medication.  Pt is also asking if Dr Epimenio Foot will fill his  desvenlafaxine (PRISTIQ) 100 MG 24 hr tablet.  If so please send it to  [facility] - [place], [place] - [address] [phone] (Phone) [phone] (Fax)

## 2018-04-22 NOTE — Telephone Encounter (Signed)
Spoke with pt.  He sts. he stopped Wellbutrin 4 days ago (felt bad on it).  Has not had any side effects since stopping. I have explained he can stay off of it.  He sts. he has continued Pristiq 100mg  daily, rx'd by Dr. Tinnie Gens, and feels it is beneficial; would like to continue it. Sts. he took his last tablet yesterday and would like for Dr. Epimenio Foot to r/f this. Per RAS, ok to r/f.  Rx. escribed to [facility] per pt's request.  He has a pending 9/18 appt. with RAS and will discuss further at that time/fim

## 2018-04-22 NOTE — Addendum Note (Signed)
Addended by: Candis Schatz I on: 04/22/2018 11:19 AM   Modules accepted: Orders

## 2018-05-06 ENCOUNTER — Encounter: Payer: Medicare Other | Admitting: Psychology

## 2018-05-07 ENCOUNTER — Encounter: Payer: Self-pay | Admitting: Neurology

## 2018-05-07 ENCOUNTER — Other Ambulatory Visit: Payer: Self-pay

## 2018-05-07 ENCOUNTER — Ambulatory Visit: Payer: Medicare Other | Admitting: Neurology

## 2018-05-07 VITALS — BP 121/75 | HR 81 | Resp 18 | Ht 71.0 in | Wt 202.0 lb

## 2018-05-07 DIAGNOSIS — F988 Other specified behavioral and emotional disorders with onset usually occurring in childhood and adolescence: Secondary | ICD-10-CM | POA: Diagnosis not present

## 2018-05-07 DIAGNOSIS — R5383 Other fatigue: Secondary | ICD-10-CM | POA: Diagnosis not present

## 2018-05-07 DIAGNOSIS — R9082 White matter disease, unspecified: Secondary | ICD-10-CM | POA: Diagnosis not present

## 2018-05-07 DIAGNOSIS — M791 Myalgia, unspecified site: Secondary | ICD-10-CM | POA: Diagnosis not present

## 2018-05-07 DIAGNOSIS — F418 Other specified anxiety disorders: Secondary | ICD-10-CM

## 2018-05-07 MED ORDER — AMPHETAMINE-DEXTROAMPHET ER 20 MG PO CP24
20.0000 mg | ORAL_CAPSULE | Freq: Every day | ORAL | 0 refills | Status: DC
Start: 2018-05-07 — End: 2018-09-08

## 2018-05-07 MED ORDER — DESVENLAFAXINE SUCCINATE ER 100 MG PO TB24: ORAL_TABLET | ORAL | 0 refills | Status: DC

## 2018-05-07 NOTE — Progress Notes (Signed)
[facility]  PATIENT: Paul Pope DOB: February 13, 1956  REFERRING DOCTOR OR PCP:  Benita Stabile SOURCE: Patient, notes from Dr. Margo Aye, multiple laboratory and MRI reports, multiple MRIs on PACS personally reviewed.  _________________________________   HISTORICAL  CHIEF COMPLAINT:  Chief Complaint  Patient presents with  . Multiple Sclerosis    No currently on a dmt. Sts. fatigue is worse. He is taking Pristiq, but stopped Welbutrin b/c he felt bad on it--believes it casued h/a's. Sts. h/a's stopped when he stopped Welbutrin. Sts. has lost interest in most activities due to fatigue.  Yesterday had an episode of left sided facial pain that lasted for several hours. Ice packs helped. Sts. he is taking Adderall XR 10mg  once daily./fim    HISTORY OF PRESENT ILLNESS:  Paul Pope is a 62 yo man diagnosed with MS in the past.   His main issues are fatigue and pain.    Update 05/07/2018: He has noted some fluctuations in how he feels.   His main problem is fatigue.  Fatigue is both physical and cognitive.   He feels worse with heat.   He feels apathetic.       He sleeps well most nights.   He feels more forgetful as well.   He has had left facial pain.  Pain was constant not shocklike.   He takes gabapentin 900 mg x 4 for pain and also takes oxycodone.  He feels his gait is doing okay.  He denies weakness though he tires out easily.  He has dysesthesias.  Bladder function is fine..  At his initial visit, Adderall XR 10 mg was started.   However, in combination with the Wellbutrin he felt palpitations and had insomnia.     Wellbutrin also caused headaches.    He saw Rheumatology and was told he had psoriasis but not psoriatic arthritis.     From 02/11/2018:. He began to experience migraines in the 1990's.   He was diagnosed with MS in 2002 after presenting with spasticity in his upper back and legs after heavy activity.    He could not get his muscles to relax in his neck.   He  also had psoriatic arthritis.   He had an MRI of the brain showing white matter foci.    He was referred to Dr. Anne Hahn.   He had an LP around 2002.   He believes it did not show any bands.  Dr. Anne Hahn sent him to Dr. Leotis Shames for a second opinion.    He was placed on Betaseron, then Rebif but had trouble tolerating them.   He started Copaxone and tolerated it well.   In 2012, an MRI showed some progression and he was switched to Gilenya.   He noted more bad days then good days and decided to switch medications.  He stopped Gilenya March 2019.   He feels the same off Gilenya as he did on the medication.    Ocrevus has been recommended and he is scheduled to hava his first split dose in a couple weeks.   He is not certain he wants to start Ocrevus.      He walks well most days but he feels weaker as he walks longer distances.   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    Strength is good.     He gets pain in his legs but has no limb numbness.  There is some burning in his arms.  Sometimes the left  face feels numb.    He is on gabapentin 300 mg po qid with benefit.   He notes muscle spasticity and does better on baclofen 10 mg 3-4 times a day.   A higher dose causes headache.    He was once on tizanidine but does not recall whether it helped.  He has urinary hesitancy since the 1980's.   He has been told the prostate was fine.    He was diagnosed with a 'shy bladder'.   Flomax had not helped.       His legs fatigue very easily, especially in hot weather.   He sleeps 6 hours or so most nights.   SOmetimes he gets hot flashes.   He snores some but his wife does not note any OSA like sounds or pauses.  He has a long history of depression and is on Pristiq 100 mg.   He tried Cymbalta and did better for a while but then the benefit stopped.      Cognitively, he notes poor focus and attention.         He has a lot of pain in his back and legs.   He takes Oxycodone 7.5 mg 4 times a day.    He has had lumbar  surgery in the past by Dr. Otelia Sergeant in 2004 (either L4L5 or L5S1).    I personally reviewed the following imaging studies: MRI brain 08/16/2017:    Multiple T2/FLAIR hyperintense foci in the hemispheres, predominantly in the subcortical and deep white matter.  There are no classic periventricular foci.  No foci are noted in the infratentorial white matter.  Normal enhancement pattern.    Compared to the MRI dated 09/05/2010, minimal interval change. MRI cervical 01/21/2015:   Spinal cord is normal.  Mild multilevel degenerative changes with no nerve root compression. MRI brain 09/05/2010:    Multiple T2/FLAIR hyperintense foci in the hemispheres, predominantly in the subcortical and deep white matter.  There are no classic periventricular foci.  No foci are noted in the infratentorial white matter.  Normal enhancement pattern.   Very Mild progression compared to 2003 MRI brain 03/03/2002:   Multiple T2/FLAIR hyperintense foci in the hemispheres, predominantly in the subcortical and deep white matter.  There are no classic periventricular foci.  No foci are noted in the infratentorial white matter.  Normal enhancement pattern.  Vascular risks:   Does not smoke, has benign essential hypertension, is not diabetic, no cardiac history.     REVIEW OF SYSTEMS: Constitutional: No fevers, chills, sweats, or change in appetite Eyes: No visual changes, double vision, eye pain Ear, nose and throat: No hearing loss, ear pain, nasal congestion, sore throat Cardiovascular: No chest pain, palpitations Respiratory: No shortness of breath at rest or with exertion.   No wheezes GastrointestinaI: No nausea, vomiting, diarrhea, abdominal pain, fecal incontinence Genitourinary: No dysuria, urinary retention or frequency.  No nocturia. Musculoskeletal: No neck pain, back pain Integumentary: No rash, pruritus, skin lesions Neurological: as above Psychiatric: No depression at this time.  No anxiety Endocrine: No  palpitations, diaphoresis, change in appetite, change in weigh or increased thirst Hematologic/Lymphatic: No anemia, purpura, petechiae. Allergic/Immunologic: No itchy/runny eyes, nasal congestion, recent allergic reactions, rashes  ALLERGIES: Allergies  Allergen Reactions  . Sulfa Antibiotics     HOME MEDICATIONS:  Current Outpatient Medications:  .  acidophilus (RISAQUAD) CAPS capsule, Take 1 capsule by mouth daily., Disp: , Rfl:  .  amphetamine-dextroamphetamine (ADDERALL XR) 20 MG 24 hr capsule, Take 1  capsule (20 mg total) by mouth daily., Disp: 30 capsule, Rfl: 0 .  baclofen (LIORESAL) 20 MG tablet, Take 20 mg by mouth 4 (four) times daily as needed for muscle spasms. , Disp: , Rfl:  .  cholecalciferol (VITAMIN D) 1000 units tablet, Take 3,000 Units by mouth daily., Disp: , Rfl:  .  desvenlafaxine (PRISTIQ) 100 MG 24 hr tablet, Take 1 1/2 pills x 2 weeks then increase to 2 pills po daily, Disp: 180 tablet, Rfl: 0 .  gabapentin (NEURONTIN) 600 MG tablet, Take 1 tablet (600 mg total) by mouth 3 (three) times daily. (Patient taking differently: Take 1,200 mg by mouth 3 (three) times daily. ), Disp: 120 tablet, Rfl: 5 .  lisinopril (PRINIVIL,ZESTRIL) 10 MG tablet, Take 1 tablet by mouth daily., Disp: , Rfl:  .  oxyCODONE-acetaminophen (PERCOCET) 7.5-325 MG per tablet, Take 1 tablet by mouth 4 (four) times daily. , Disp: , Rfl:  .  polyethylene glycol (MIRALAX / GLYCOLAX) packet, Take 17 g by mouth daily as needed (constipation)., Disp: , Rfl:  .  aspirin EC 81 MG tablet, Take 81 mg by mouth daily., Disp: , Rfl:   PAST MEDICAL HISTORY: Past Medical History:  Diagnosis Date  . Hypertension   . Kidney stones   . Migraine   . Multiple sclerosis (HCC)   . Vision abnormalities     PAST SURGICAL HISTORY: Past Surgical History:  Procedure Laterality Date  . BACK SURGERY    . FOOT SURGERY    . I&D EXTREMITY Left 08/04/2016   Procedure: IRRIGATION AND DEBRIDEMENT EXTREMITY LEFT HAND;   Surgeon: Knute Neu, MD;  Location: [facility] OR;  Service: Plastics;  Laterality: Left;    FAMILY HISTORY: Family History  Problem Relation Age of Onset  . Atrial fibrillation Mother   . Dementia Mother   . Heart disease Mother   . Lung cancer Father     SOCIAL HISTORY:  Social History   Socioeconomic History  . Marital status: Married    Spouse name: Not on file  . Number of children: Not on file  . Years of education: Not on file  . Highest education level: Not on file  Occupational History  . Not on file  Social Needs  . Financial resource strain: Not on file  . Food insecurity:    Worry: Not on file    Inability: Not on file  . Transportation needs:    Medical: Not on file    Non-medical: Not on file  Tobacco Use  . Smoking status: Never Smoker  . Smokeless tobacco: Never Used  Substance and Sexual Activity  . Alcohol use: No  . Drug use: No  . Sexual activity: Not on file  Lifestyle  . Physical activity:    Days per week: Not on file    Minutes per session: Not on file  . Stress: Not on file  Relationships  . Social connections:    Talks on phone: Not on file    Gets together: Not on file    Attends religious service: Not on file    Active member of club or organization: Not on file    Attends meetings of clubs or organizations: Not on file    Relationship status: Not on file  . Intimate partner violence:    Fear of current or ex partner: Not on file    Emotionally abused: Not on file    Physically abused: Not on file    Forced sexual activity: Not on  file  Other Topics Concern  . Not on file  Social History Narrative  . Not on file     PHYSICAL EXAM  Vitals:   05/07/18 1047  BP: 121/75  Pulse: 81  Resp: 18  Weight: 202 lb (91.6 kg)  Height: 5\' 11"  (1.803 m)    Body mass index is 28.17 kg/m.   General: The patient is well-developed and well-nourished and in no acute distress   Neck/Ext: He has neck tenderness paraspinal muscles with  good range of motion.  Extremities are without rash or edema.  He has tenderness over many of the proximal muscles to deep palpation  Musculoskeletal:  Back is tender  Neurologic Exam  Mental status: The patient is alert and oriented x 3 at the time of the examination. The patient has apparent normal recent and remote memory, with mildly reduced attention span and mild word finding difficulties..  Cranial nerves: Extraocular movements are full.  Facial strength and sensation was normal.  Trapezius strength was normal.  The tongue is midline, and the patient has symmetric elevation of the soft palate. No obvious hearing deficits are noted.  Motor:  Muscle bulk is normal.   Tone is normal. Strength is  5 / 5 in all 4 extremities.   Sensory: He reported intact sensation to touch and vibration except for mild vibration asymmetry at the knees.  Coordination: Cerebellar testing reveals good finger-nose-finger and heel-to-shin bilaterally.  Gait and station: Station is normal.   The gait is arthritic and the tandem gait is mildly wide.. Romberg is negative.   Reflexes: Deep tendon reflexes are symmetric and normal bilaterally.      DIAGNOSTIC DATA (LABS, IMAGING, TESTING) - I reviewed patient records, labs, notes, testing and imaging myself where available.  Lab Results  Component Value Date   WBC 6.1 08/31/2014   HGB 15.4 08/31/2014   HCT 44.3 08/31/2014   MCV 87.5 08/31/2014   PLT 214 08/31/2014      Component Value Date/Time   NA 140 08/31/2014 1120   K 3.7 08/31/2014 1120   CL 103 08/31/2014 1120   CO2 28 08/31/2014 1120   GLUCOSE 121 (H) 08/31/2014 1120   BUN 21 08/31/2014 1120   CREATININE 0.80 08/31/2014 1120   CALCIUM 9.7 08/31/2014 1120   GFRNONAA >90 08/31/2014 1120   GFRAA >90 08/31/2014 1120       ASSESSMENT AND PLAN  White matter abnormality on MRI of brain  Myalgia  Other fatigue  Attention deficit disorder, unspecified hyperactivity  presence  Depression with anxiety  1.  I will increase his Pristiq to 200 mg (150 mg x 2 weeks) to see if that helps mood, apathy and possibly some of the pain. 2.    Adderall XR 20 mg 4 decreased focus and attention and fatigue.  He tolerated it well after the last visit. 3.    For now he will remain off of therapy but if symptoms worsen consider starting help the psoriasis. 4.    He will return to see me in 4 months or sooner if there are new or worsening neurologic symptoms.  Juaquin Ludington A. Epimenio Foot, MD, PhD, FAAN Certified in Neurology, Clinical Neurophysiology, Sleep Medicine, Pain Medicine and Neuroimaging Director, [facility]  [facility] [address] [place], [place] [address] [phone]

## 2018-05-14 ENCOUNTER — Telehealth: Payer: Self-pay | Admitting: Neurology

## 2018-05-14 NOTE — Telephone Encounter (Signed)
Spoke with pt. and explained that Dr. Epimenio Foot does not treat psoriasis.  In his last ov note, he mentioned that some MS dmt's can also be beneficial for psoriasis, however he does not treat psoriasis alone.  He verbalized understanding of same and st.s will f/u with his dermatologist/fim

## 2018-05-14 NOTE — Telephone Encounter (Signed)
Patient calling to discuss getting medication for psoriasis as discussed at last week's office visit. Patient uses [facility] in [place].

## 2018-06-03 ENCOUNTER — Encounter: Payer: Medicare Other | Admitting: Psychology

## 2018-07-18 ENCOUNTER — Other Ambulatory Visit: Payer: Self-pay

## 2018-07-18 NOTE — Patient Outreach (Signed)
[facility]) Care Management  07/18/2018  Paul Pope [birth year] [id number]   Medication Adherence call to Mr. [name] left a message for patient to call back patient is due on Lisinopril 10 mg. [name] is showing past due under [facility] Ins.    Lillia Abed CPhT Pharmacy Technician [facility] Management Direct Dial [phone]  Fax [phone] [email or web address]

## 2018-09-08 ENCOUNTER — Telehealth: Payer: Self-pay | Admitting: Neurology

## 2018-09-08 ENCOUNTER — Ambulatory Visit: Payer: Medicare Other | Admitting: Neurology

## 2018-09-08 ENCOUNTER — Other Ambulatory Visit: Payer: Self-pay

## 2018-09-08 ENCOUNTER — Encounter: Payer: Self-pay | Admitting: Neurology

## 2018-09-08 VITALS — BP 119/76 | HR 76 | Ht 71.0 in | Wt 201.0 lb

## 2018-09-08 DIAGNOSIS — R9082 White matter disease, unspecified: Secondary | ICD-10-CM

## 2018-09-08 DIAGNOSIS — G8929 Other chronic pain: Secondary | ICD-10-CM | POA: Insufficient documentation

## 2018-09-08 DIAGNOSIS — M791 Myalgia, unspecified site: Secondary | ICD-10-CM

## 2018-09-08 DIAGNOSIS — M542 Cervicalgia: Secondary | ICD-10-CM | POA: Insufficient documentation

## 2018-09-08 DIAGNOSIS — R5383 Other fatigue: Secondary | ICD-10-CM | POA: Diagnosis not present

## 2018-09-08 DIAGNOSIS — L409 Psoriasis, unspecified: Secondary | ICD-10-CM | POA: Insufficient documentation

## 2018-09-08 DIAGNOSIS — F418 Other specified anxiety disorders: Secondary | ICD-10-CM

## 2018-09-08 DIAGNOSIS — M549 Dorsalgia, unspecified: Secondary | ICD-10-CM

## 2018-09-08 DIAGNOSIS — M545 Low back pain, unspecified: Secondary | ICD-10-CM

## 2018-09-08 MED ORDER — ETODOLAC 400 MG PO TABS: 400.0000 mg | ORAL_TABLET | Freq: Two times a day (BID) | ORAL | 5 refills | Status: DC

## 2018-09-08 MED ORDER — LEFLUNOMIDE 20 MG PO TABS: 20.0000 mg | ORAL_TABLET | Freq: Every day | ORAL | 11 refills | Status: DC

## 2018-09-08 MED ORDER — BUPROPION HCL ER (XL) 150 MG PO TB24: 150.0000 mg | ORAL_TABLET | Freq: Every day | ORAL | 11 refills | Status: DC

## 2018-09-08 NOTE — Telephone Encounter (Signed)
[employer] medicare order sent to [facility] pt is aware °

## 2018-09-08 NOTE — Progress Notes (Addendum)
[facility]  PATIENT: Paul Pope DOB: [birth year]  REFERRING DOCTOR OR PCP:  Benita Stabile SOURCE: Patient, notes from Dr. Margo Aye, multiple laboratory and MRI reports, multiple MRIs on PACS personally reviewed.  _________________________________   HISTORICAL  CHIEF COMPLAINT:  Chief Complaint  Patient presents with  . Follow-up    RM 12 with wife. Last seen 05/07/18. Stopped adderall. States it gave him headaches.   . Multiple Sclerosis    Not on a DMT.   . Trigeminal Neuralgia    Reports shooting pain on left side of face that is intermittent. Has worsened in the last couple weeks. Has had 4-5 episodes in the last year.     HISTORY OF PRESENT ILLNESS:  Paul Pope is a 63 yo man diagnosed with MS in the past.   His main issues are fatigue and pain.    Update 09/08/2018: He remains off of a disease modifying therapy for his MS by his choice.  He has been on multiple disease modifying therapies in the past including Rebif, Copaxone and Gilenya.  He stopped Gilenya in March 2019.   His MRI 2018 showed mostly   He denies weakness in the legs.  He feels his gait is okay though he feels weaker with his walking when he goes longer distances.   He has spasticity but can only tolerate low dose of baclofen 5 mg 2-3 times a day.    He denies numbness or dysesthesia.  He has some urinary hesitancy for 30 years that had not improved with Flomax in the past and has not worsened.  Vision is fine.  He has atypical trigeminal neuralgia with left facial pain.  He has numbness in the left face.   He always has discomfort with superimposed neuralgic shooting pain.    He once tried Tegretol but it was poorly tolerated.    He has pain and muscle tightness in the neck and shoulders.    His main problem has been fatigue that is physical more than cognitive.  Fatigue is always worse in the heat.  He also notes a lot of apathy.  He has had some depression and is on Pristiq.   He is not  sure  Update 05/07/2018: He has noted some fluctuations in how he feels.   His main problem is fatigue.  Fatigue is both physical and cognitive.   He feels worse with heat.   He feels apathetic.       He sleeps well most nights.   He feels more forgetful as well.   He has had left facial pain.  Pain was constant not shocklike.   He takes gabapentin 900 mg x 4 for pain and also takes oxycodone.  He feels his gait is doing okay.  He denies weakness though he tires out easily.  He has dysesthesias.  Bladder function is fine..  At his initial visit, Adderall XR 10 mg was started.   However, in combination with the Wellbutrin he felt palpitations and had insomnia.     Wellbutrin also caused headaches.    He saw Rheumatology and was told he had psoriasis but not psoriatic arthritis.     From 02/11/2018:. He began to experience migraines in the 1990's.   He was diagnosed with MS in 2002 after presenting with spasticity in his upper back and legs after heavy activity.    He could not get his muscles to relax in his neck.   He also had psoriatic arthritis.  He had an MRI of the brain showing white matter foci.    He was referred to Dr. Anne HahnWillis.   He had an LP around 2002.   He believes it did not show any bands.  Dr. Anne HahnWillis sent him to Dr. Leotis ShamesJeffery for a second opinion.    He was placed on Betaseron, then Rebif but had trouble tolerating them.   He started Copaxone and tolerated it well.   In 2012, an MRI showed some progression and he was switched to Gilenya.   He noted more bad days then good days and decided to switch medications.  He stopped Gilenya March 2019.   He feels the same off Gilenya as he did on the medication.    Ocrevus has been recommended and he is scheduled to hava his first split dose in a couple weeks.   He is not certain he wants to start Ocrevus.      He walks well most days but he feels weaker as he walks longer distances.   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    Strength is  good.     He gets pain in his legs but has no limb numbness.  There is some burning in his arms.  Sometimes the left face feels numb.    He is on gabapentin 300 mg po qid with benefit.   He notes muscle spasticity and does better on baclofen 10 mg 3-4 times a day.   A higher dose causes headache.    He was once on tizanidine but does not recall whether it helped.  He has urinary hesitancy since the 1980's.   He has been told the prostate was fine.    He was diagnosed with a 'shy bladder'.   Flomax had not helped.       His legs fatigue very easily, especially in hot weather.   He sleeps 6 hours or so most nights.   SOmetimes he gets hot flashes.   He snores some but his wife does not note any OSA like sounds or pauses.  He has a long history of depression and is on Pristiq 100 mg.   He tried Cymbalta and did better for a while but then the benefit stopped.      Cognitively, he notes poor focus and attention.         He has a lot of pain in his back and legs.   He takes Oxycodone 7.5 mg 4 times a day.    He has had lumbar surgery in the past by Dr. Otelia SergeantNitka in 2004 (either L4L5 or L5S1).    I personally reviewed the following imaging studies: MRI brain 08/16/2017:    Multiple T2/FLAIR hyperintense foci in the hemispheres, predominantly in the subcortical and deep white matter.  There are no classic periventricular foci.  No foci are noted in the infratentorial white matter.  Normal enhancement pattern.    Compared to the MRI dated 09/05/2010, minimal interval change. MRI cervical 01/21/2015:   Spinal cord is normal.  Mild multilevel degenerative changes with no nerve root compression. MRI brain 09/05/2010:    Multiple T2/FLAIR hyperintense foci in the hemispheres, predominantly in the subcortical and deep white matter.  There are no classic periventricular foci.  No foci are noted in the infratentorial white matter.  Normal enhancement pattern.   Very Mild progression compared to 2003 MRI brain 03/03/2002:    Multiple T2/FLAIR hyperintense foci in the hemispheres, predominantly in the subcortical  and deep white matter.  There are no classic periventricular foci.  No foci are noted in the infratentorial white matter.  Normal enhancement pattern.  Vascular risks:   Does not smoke, has benign essential hypertension, is not diabetic, no cardiac history.     REVIEW OF SYSTEMS: Constitutional: No fevers, chills, sweats, or change in appetite Eyes: No visual changes, double vision, eye pain Ear, nose and throat: No hearing loss, ear pain, nasal congestion, sore throat Cardiovascular: No chest pain, palpitations Respiratory: No shortness of breath at rest or with exertion.   No wheezes GastrointestinaI: No nausea, vomiting, diarrhea, abdominal pain, fecal incontinence Genitourinary: No dysuria, urinary retention or frequency.  No nocturia. Musculoskeletal: No neck pain, back pain Integumentary: No rash, pruritus, skin lesions Neurological: as above Psychiatric: No depression at this time.  No anxiety Endocrine: No palpitations, diaphoresis, change in appetite, change in weigh or increased thirst Hematologic/Lymphatic: No anemia, purpura, petechiae. Allergic/Immunologic: No itchy/runny eyes, nasal congestion, recent allergic reactions, rashes  ALLERGIES: Allergies  Allergen Reactions  . Sulfa Antibiotics   . Adderall [Amphetamine-Dextroamphetamine]     Pt reports increase in headaches while on medication    HOME MEDICATIONS:  Current Outpatient Medications:  .  acidophilus (RISAQUAD) CAPS capsule, Take 1 capsule by mouth daily., Disp: , Rfl:  .  aspirin EC 81 MG tablet, Take 81 mg by mouth daily., Disp: , Rfl:  .  baclofen (LIORESAL) 20 MG tablet, Take 20 mg by mouth 4 (four) times daily as needed for muscle spasms. , Disp: , Rfl:  .  cholecalciferol (VITAMIN D) 1000 units tablet, Take 3,000 Units by mouth daily., Disp: , Rfl:  .  desvenlafaxine (PRISTIQ) 100 MG 24 hr tablet, Take 1 1/2  pills x 2 weeks then increase to 2 pills po daily, Disp: 180 tablet, Rfl: 0 .  gabapentin (NEURONTIN) 600 MG tablet, Take 1 tablet (600 mg total) by mouth 3 (three) times daily. (Patient taking differently: Take 1,200 mg by mouth 3 (three) times daily. ), Disp: 120 tablet, Rfl: 5 .  lisinopril (PRINIVIL,ZESTRIL) 10 MG tablet, Take 1 tablet by mouth daily., Disp: , Rfl:  .  oxyCODONE-acetaminophen (PERCOCET) 7.5-325 MG per tablet, Take 1 tablet by mouth 4 (four) times daily. , Disp: , Rfl:  .  polyethylene glycol (MIRALAX / GLYCOLAX) packet, Take 17 g by mouth daily as needed (constipation)., Disp: , Rfl:   PAST MEDICAL HISTORY: Past Medical History:  Diagnosis Date  . Hypertension   . Kidney stones   . Migraine   . Multiple sclerosis (HCC)   . Vision abnormalities     PAST SURGICAL HISTORY: Past Surgical History:  Procedure Laterality Date  . BACK SURGERY    . FOOT SURGERY    . I&D EXTREMITY Left 08/04/2016   Procedure: IRRIGATION AND DEBRIDEMENT EXTREMITY LEFT HAND;  Surgeon: Knute Neu, MD;  Location: [facility] OR;  Service: Plastics;  Laterality: Left;    FAMILY HISTORY: Family History  Problem Relation Age of Onset  . Atrial fibrillation Mother   . Dementia Mother   . Heart disease Mother   . Lung cancer Father     SOCIAL HISTORY:  Social History   Socioeconomic History  . Marital status: Married    Spouse name: Not on file  . Number of children: Not on file  . Years of education: Not on file  . Highest education level: Not on file  Occupational History  . Not on file  Social Needs  . Financial resource strain:  Not on file  . Food insecurity:    Worry: Not on file    Inability: Not on file  . Transportation needs:    Medical: Not on file    Non-medical: Not on file  Tobacco Use  . Smoking status: Never Smoker  . Smokeless tobacco: Never Used  Substance and Sexual Activity  . Alcohol use: No  . Drug use: No  . Sexual activity: Not on file  Lifestyle  .  Physical activity:    Days per week: Not on file    Minutes per session: Not on file  . Stress: Not on file  Relationships  . Social connections:    Talks on phone: Not on file    Gets together: Not on file    Attends religious service: Not on file    Active member of club or organization: Not on file    Attends meetings of clubs or organizations: Not on file    Relationship status: Not on file  . Intimate partner violence:    Fear of current or ex partner: Not on file    Emotionally abused: Not on file    Physically abused: Not on file    Forced sexual activity: Not on file  Other Topics Concern  . Not on file  Social History Narrative  . Not on file     PHYSICAL EXAM  Vitals:   09/08/18 1040  BP: 119/76  Pulse: 76  Weight: 201 lb (91.2 kg)  Height: 5\' 11"  (1.803 m)    Body mass index is 28.03 kg/m.   General: The patient is well-developed and well-nourished and in no acute distress.    He has scaly skin changes on his hands and elbows.    Neck/Ext: He has neck tenderness paraspinal muscles with good range of motion. He has tenderness over many of the proximal muscles to deep palpation  Musculoskeletal:  Back is tender  Neurologic Exam  Mental status: The patient is alert and oriented x 3 at the time of the examination. The patient has apparent normal recent and remote memory, with mildly reduced attention span and mild word finding difficulties..  Cranial nerves: Extraocular movements are full.  Facial strength and sensation was normal.  Trapezius strength was normal.  The tongue is midline, and the patient has symmetric elevation of the soft palate. No obvious hearing deficits are noted.  Motor:  Muscle bulk is normal.   Tone is normal. Strength is  5 / 5 in all 4 extremities.   Sensory: He reported intact sensation to touch and vibration except for mild vibration asymmetry at the knees.  Coordination: Finger-nose-finger and heel-to-shin is performed  well.  Gait and station: Station is normal.   The gait is arthritic.  Tandem gait is mildly wide.  Romberg is negative.   Reflexes: Deep tendon reflexes are symmetric and normal bilaterally.      DIAGNOSTIC DATA (LABS, IMAGING, TESTING) - I reviewed patient records, labs, notes, testing and imaging myself where available.  Lab Results  Component Value Date   WBC 6.1 08/31/2014   HGB 15.4 08/31/2014   HCT 44.3 08/31/2014   MCV 87.5 08/31/2014   PLT 214 08/31/2014      Component Value Date/Time   NA 140 08/31/2014 1120   K 3.7 08/31/2014 1120   CL 103 08/31/2014 1120   CO2 28 08/31/2014 1120   GLUCOSE 121 (H) 08/31/2014 1120   BUN 21 08/31/2014 1120   CREATININE 0.80 08/31/2014 1120  CALCIUM 9.7 08/31/2014 1120   GFRNONAA >90 08/31/2014 1120   GFRAA >90 08/31/2014 1120      ASSESSMENT AND PLAN  White matter abnormality on MRI of brain  Other fatigue  Myalgia  Depression with anxiety  Neck pain  Chronic midline low back pain, unspecified whether sciatica present  Psoriasis  1.  Continue Gabapentin and  Pristiq for mood, apathy and pain. 2.    Add Wellbutrin XL 150 mg daily and consider increase to 300 mg based on response.  Add diclofenac as needed for pain twice a day 3.    He has a diagnosis of psoriasis.  I feel he most likely does not have MS as the changes on MRI look more consistent with chronic microvascular ischemic change.  The MRIs were stable between 2012 and 2018.  We will check an MRI of the brain to determine if he has had any subclinical progression.  If present, then we will need to reconsider MS as a possibility..    Leflunomide may help the psoriasis and be safe in case her does have MS (should not go on anti-TNF agents).  He is also advised to follow-up with dermatology. 4.    He will return to see me in 4 months or sooner if there are new or worsening neurologic symptoms.  40-minute face-to-face evaluation with greater than half the time  counseling coordinating care regarding the white matter changes in the brain, possibility of MS versus chronic microvascular ischemic change, psoriasis, pain, fatigue, facial pain.  Richard A. Epimenio FootSater, MD, PhD, FAAN Certified in Neurology, Clinical Neurophysiology, Sleep Medicine, Pain Medicine and Neuroimaging Director, [facility]  [facility] [address], [address] [place], [place] [id number] [phone]

## 2018-09-09 LAB — HEPATIC FUNCTION PANEL
ALBUMIN: 5 g/dL — AB (ref 3.8–4.8)
ALT: 20 IU/L (ref 0–44)
AST: 19 IU/L (ref 0–40)
Alkaline Phosphatase: 79 IU/L (ref 39–117)
Bilirubin Total: 0.4 mg/dL (ref 0.0–1.2)
Bilirubin, Direct: 0.13 mg/dL (ref 0.00–0.40)
TOTAL PROTEIN: 7 g/dL (ref 6.0–8.5)

## 2018-09-09 LAB — CBC WITH DIFFERENTIAL/PLATELET
BASOS ABS: 0 10*3/uL (ref 0.0–0.2)
Basos: 1 %
EOS (ABSOLUTE): 0.1 10*3/uL (ref 0.0–0.4)
Eos: 1 %
HEMOGLOBIN: 15.7 g/dL (ref 13.0–17.7)
Hematocrit: 44 % (ref 37.5–51.0)
Immature Grans (Abs): 0 10*3/uL (ref 0.0–0.1)
Immature Granulocytes: 0 %
Lymphocytes Absolute: 0.6 10*3/uL — ABNORMAL LOW (ref 0.7–3.1)
Lymphs: 10 %
MCH: 31.3 pg (ref 26.6–33.0)
MCHC: 35.7 g/dL (ref 31.5–35.7)
MCV: 88 fL (ref 79–97)
MONOCYTES: 8 %
MONOS ABS: 0.5 10*3/uL (ref 0.1–0.9)
Neutrophils Absolute: 4.8 10*3/uL (ref 1.4–7.0)
Neutrophils: 80 %
Platelets: 236 10*3/uL (ref 150–450)
RBC: 5.02 x10E6/uL (ref 4.14–5.80)
RDW: 12.3 % (ref 11.6–15.4)
WBC: 6.1 10*3/uL (ref 3.4–10.8)

## 2018-09-09 LAB — THYROID PANEL WITH TSH
Free Thyroxine Index: 1.8 (ref 1.2–4.9)
T3 Uptake Ratio: 25 % (ref 24–39)
T4 TOTAL: 7.1 ug/dL (ref 4.5–12.0)
TSH: 1.16 u[IU]/mL (ref 0.450–4.500)

## 2018-09-10 ENCOUNTER — Telehealth: Payer: Self-pay | Admitting: *Deleted

## 2018-09-10 NOTE — Telephone Encounter (Signed)
-----   Message from Asa Lente, MD sent at 09/09/2018  5:12 PM EST ----- Please let him know that the lab work looks okay.  The lymphocyte count was very slightly low but that is okay.

## 2018-09-10 NOTE — Telephone Encounter (Signed)
Pt called back. I relayed results. He verbalized understanding and appreciation.

## 2018-09-10 NOTE — Telephone Encounter (Signed)
Tried calling pt. Went to automated message where calls are screened per message via google. Tried contacting to pt but call was ended on his end.

## 2018-09-15 ENCOUNTER — Telehealth: Payer: Self-pay | Admitting: Neurology

## 2018-09-15 NOTE — Telephone Encounter (Signed)
Pt is asking for a call to discuss how to take  leflunomide (ARAVA) 20 MG tablet and to discuss concerns he has about other medications, please call

## 2018-09-15 NOTE — Telephone Encounter (Addendum)
I called pt back. He started on etodolac 400mg , 1 tab BID and leflunomide 20mg , 1 tab TID. He is also taking Wellbutrin 150mg , 1 tab qd. Since starting medications, he has flusing, GI upset, cannot swallow pills. Last night he woke up and was vomiting. He feels "off". Advised I will speak with Dr. Epimenio Foot and will call back to advise on next steps. He verbalized understanding.

## 2018-09-15 NOTE — Telephone Encounter (Signed)
Spoke with Dr. Epimenio Foot- he would like him to go to taking lefludomide: 1/2 tablet daily for 2 weeks and then increase back to 1 tablet daily to see if this helps improve his sx

## 2018-09-15 NOTE — Telephone Encounter (Signed)
Called patient back and relayed Dr. Bonnita Hollow recommendation to reduce leflunomide to 1/2 tab dailyx2 weeks and then increase back to 1 tab qd. He verbalized understanding. He will call back if sx do not improve.

## 2018-09-17 ENCOUNTER — Other Ambulatory Visit: Payer: Medicare Other

## 2018-10-22 ENCOUNTER — Ambulatory Visit
Admission: RE | Admit: 2018-10-22 | Discharge: 2018-10-22 | Disposition: A | Discharge status: Home / Self Care | Payer: Medicare Other | Source: Ambulatory Visit | Attending: Neurology | Admitting: Neurology

## 2018-10-22 DIAGNOSIS — R9082 White matter disease, unspecified: Secondary | ICD-10-CM

## 2018-10-22 DIAGNOSIS — R5383 Other fatigue: Secondary | ICD-10-CM

## 2018-10-22 DIAGNOSIS — M542 Cervicalgia: Secondary | ICD-10-CM

## 2018-10-22 MED ORDER — GADOBENATE DIMEGLUMINE 529 MG/ML IV SOLN
17.0000 mL | Freq: Once | INTRAVENOUS | Status: AC | PRN
  Administered 2018-10-22: 17 mL via INTRAVENOUS

## 2018-10-23 ENCOUNTER — Telehealth: Payer: Self-pay | Admitting: *Deleted

## 2018-10-23 NOTE — Telephone Encounter (Signed)
Spoke with Paul Pope and reviewed below MRI results.  He verbalized understanding of same/fim

## 2018-10-23 NOTE — Telephone Encounter (Signed)
-----   Message from Asa Lente, MD sent at 10/23/2018  3:41 PM EST ----- Please let him know that the MRI of the brain did not show any new lesions compared to the MRI in 2018..  The MRI of the spinal cord showed some mild disc degenerative changes, typical for age but the spinal cord looks normal.

## 2018-11-13 ENCOUNTER — Telehealth: Payer: Self-pay | Admitting: Neurology

## 2018-11-13 MED ORDER — BUPROPION HCL 75 MG PO TABS: ORAL_TABLET | ORAL | 5 refills | Status: DC

## 2018-11-13 NOTE — Telephone Encounter (Signed)
Pt states when he was on buPROPion (WELLBUTRIN XL) 150 MG 24 hr tablet at 300 mg it gave him headaches.  Pt states at 150mg  it is not doing much for him.  Pt is asking for an increase just not back to 300mg .  Please call

## 2018-11-13 NOTE — Telephone Encounter (Signed)
Dr. Sater- please advise 

## 2018-11-13 NOTE — Telephone Encounter (Signed)
Called pt. Relayed Dr. Bonnita Hollow recommendation. He is agreeable to try this. I sent in rx. Pt verbalized understanding.

## 2018-11-13 NOTE — Addendum Note (Signed)
Addended by: Hillis Range on: 11/13/2018 04:01 PM   Modules accepted: Orders

## 2018-11-13 NOTE — Telephone Encounter (Signed)
We can do 225 mg --- it may have to be 3 x 75 mg

## 2018-12-15 ENCOUNTER — Other Ambulatory Visit: Payer: Self-pay | Admitting: *Deleted

## 2018-12-15 MED ORDER — BACLOFEN 10 MG PO TABS: ORAL_TABLET | ORAL | 3 refills | Status: AC

## 2018-12-23 ENCOUNTER — Ambulatory Visit: Payer: Medicare Other | Admitting: Neurology

## 2019-01-07 ENCOUNTER — Ambulatory Visit: Payer: Medicare Other | Admitting: Neurology

## 2019-01-13 ENCOUNTER — Telehealth: Payer: Self-pay | Admitting: Neurology

## 2019-01-13 MED ORDER — BUPROPION HCL ER (XL) 300 MG PO TB24: 300.0000 mg | ORAL_TABLET | Freq: Every day | ORAL | 5 refills | Status: DC

## 2019-01-13 NOTE — Telephone Encounter (Signed)
I called pt back. He will d/c wellbutrin 75mg  rx. I d/c's on med list. E-scribed wellbutrin 300mg  XL to pharmacy and asked pharmacy d/c 75mg  tablet on file.  He had VV scheduled for 02/17/19 but wanted sooner appt. I r/s for tomorrow at 4pm with Dr. Epimenio Foot. I sent new email for VV to [email or web address]. Updated med list/pharmacy/allergies on file.

## 2019-01-13 NOTE — Telephone Encounter (Addendum)
Pt called to r/s his 4 month appt and also was wanting to know if his buPROPion (WELLBUTRIN) 75 MG tablet can be increased back to 300mg  again. Please advise.   Pt understands that although there may be some limitations with this type of visit, we will take all precautions to reduce any security or privacy concerns.  Pt understands that this will be treated like an in office visit and we will file with pt's insurance, and there may be a patient responsible charge related to this service.  Pt has been sent the link to [email or web address]

## 2019-01-13 NOTE — Telephone Encounter (Signed)
Dr. Epimenio Foot- are you okay to increase his bupropion back to 300mg ?

## 2019-01-13 NOTE — Telephone Encounter (Signed)
Ok to go back to 300 mg  wwe can send in 6 months refills

## 2019-01-14 ENCOUNTER — Other Ambulatory Visit: Payer: Self-pay

## 2019-01-14 ENCOUNTER — Encounter: Payer: Self-pay | Admitting: Neurology

## 2019-01-14 ENCOUNTER — Ambulatory Visit (INDEPENDENT_AMBULATORY_CARE_PROVIDER_SITE_OTHER): Payer: Medicare Other | Admitting: Neurology

## 2019-01-14 DIAGNOSIS — L409 Psoriasis, unspecified: Secondary | ICD-10-CM

## 2019-01-14 DIAGNOSIS — R9082 White matter disease, unspecified: Secondary | ICD-10-CM

## 2019-01-14 DIAGNOSIS — F988 Other specified behavioral and emotional disorders with onset usually occurring in childhood and adolescence: Secondary | ICD-10-CM

## 2019-01-14 DIAGNOSIS — R5383 Other fatigue: Secondary | ICD-10-CM

## 2019-01-14 NOTE — Progress Notes (Signed)
[facility]  PATIENT: Paul Pope DOB: 03-18-1956  REFERRING DOCTOR OR PCP:  Benita Stabile SOURCE: Patient, notes from Dr. Margo Aye, multiple laboratory and MRI reports, multiple MRIs on PACS personally reviewed.  _________________________________   HISTORICAL  CHIEF COMPLAINT:  Chief Complaint  Patient presents with   Other    White matter abnormalities, possible MS.    HISTORY OF PRESENT ILLNESS:  Paul Pope is a 63 yo man diagnosed with MS in the past.   His main issues are fatigue and pain.   Update 01/14/2019: Virtual Visit via Video Note I connected with Paul Pope on 01/14/19 at  4:00 PM EDT by a video enabled telemedicine application and verified that I am speaking with the correct person.  I discussed the limitations of evaluation and management by telemedicine and the availability of in person appointments. The patient expressed understanding and agreed to proceed.  History of Present Illness: He had been diagnosed with MS in the past.    He also has psoriasis.  He had been on multiple DMT's in the past, most recently Gilenya but he stopped early 2019.   I had him try Leflunomide as it may help psoriasis and be useful if he indeed has MS.    However, he had GI upset and needed to stop after just a few pills.    A repeat MRI 10/22/2018 (brain and spine) was unchanged compared to 2018.   I personally reviewed the MRIs at today's visits changes are mostly in the deep white matter.   No infratentorial or cervical spine foci.  There are mild degenerative changes but no spinal stenosis.  He feels he loses strength in his legs and they feel heavy like wading through water.  Leg strength is about the same in both legs.   He notes spasms in his legs, especially at night.  They occur mostly in the calves.  He notes some fatigue.    He denies numbness.   Bladder function is about the same with no major changes.   Vision is about the same.    Fatigue is about the  same as last visit.  It bothers him more in the afternoons than mornings.   Sleep is poor many nights (more trouble falling asleep than staying asleep). Sleep was very poor years ago but he actually slept better for part of 2019 and very early 2020.   He has anxiety and depression.   He had more depression when Wellbutrin was reduced from 300 mg to 150 mg and we reinstated the higher dose (just started today).     He has psoriasis and a tanning bed was recommended.   His skin is doing better.    Observations/Objective: He is a well-developed well-nourished man in no acute distress.  The head is normocephalic and atraumatic.  Sclera are anicteric.  Visible skin appears normal.  He is alert and fully oriented with fluent speech and good attention, knowledge and memory.  Extraocular muscles are intact.  Facial strength is normal.  Palatal elevation and tongue protrusion are midline.  He appears to have normal strength in the arms.    Assessment and Plan: White matter abnormality on MRI of brain  Other fatigue  Attention deficit disorder, unspecified hyperactivity presence  Psoriasis  1.   We had a long discussion about the significance of the brain MRI.  There were no changes despite being off of disease modifying therapies over a year.  His white matter  abnormalities are nonspecific and mostly in the deep white matter and there are none in the infratentorial white matter or spinal cord, they could represent chronic microvascular ischemic change.  I cannot completely rule out demyelination but feel this is less likely.   He will continue off of a disease modifying therapy and we will need to check an additional MRI sometime early next year.  If 2 years have gone by without any significant changes, MS becomes even less likely. 2.   Continue to take vitamin D. 3.   We discussed taking melatonin nightly as it may aid in his sleep.  Additionally we discussed sleep hygiene issues including having a  standard bedtime.  Sleep does not improve, consider temazepam that may help his sleep and muscle spasms. 4.   He will return to see me in 6 months or sooner if there are new or worsening neurologic symptoms.  Follow Up Instructions: I discussed the assessment and treatment plan with the patient. The patient was provided an opportunity to ask questions and all were answered. The patient agreed with the plan and demonstrated an understanding of the instructions.    The patient was advised to call back or seek an in-person evaluation if the symptoms worsen or if the condition fails to improve as anticipated.  I provided 40 minutes of non-face-to-face time during this encounter.  _________________________________  Update 09/08/2018: He remains off of a disease modifying therapy for his MS by his choice.  He has been on multiple disease modifying therapies in the past including Rebif, Copaxone and Gilenya.  He stopped Gilenya in March 2019.   His MRI 2018 showed mostly   He denies weakness in the legs.  He feels his gait is okay though he feels weaker with his walking when he goes longer distances.   He has spasticity but can only tolerate low dose of baclofen 5 mg 2-3 times a day.    He denies numbness or dysesthesia.  He has some urinary hesitancy for 30 years that had not improved with Flomax in the past and has not worsened.  Vision is fine.  He has atypical trigeminal neuralgia with left facial pain.  He has numbness in the left face.   He always has discomfort with superimposed neuralgic shooting pain.    He once tried Tegretol but it was poorly tolerated.    He has pain and muscle tightness in the neck and shoulders.    His main problem has been fatigue that is physical more than cognitive.  Fatigue is always worse in the heat.  He also notes a lot of apathy.  He has had some depression and is on Pristiq.   He is not sure  Update 05/07/2018: He has noted some fluctuations in how he feels.   His  main problem is fatigue.  Fatigue is both physical and cognitive.   He feels worse with heat.   He feels apathetic.       He sleeps well most nights.   He feels more forgetful as well.   He has had left facial pain.  Pain was constant not shocklike.   He takes gabapentin 900 mg x 4 for pain and also takes oxycodone.  He feels his gait is doing okay.  He denies weakness though he tires out easily.  He has dysesthesias.  Bladder function is fine..  At his initial visit, Adderall XR 10 mg was started.   However, in combination with the Wellbutrin he felt  palpitations and had insomnia.     Wellbutrin also caused headaches.    He saw Rheumatology and was told he had psoriasis but not psoriatic arthritis.     From 02/11/2018:. He began to experience migraines in the 1990's.   He was diagnosed with MS in 2002 after presenting with spasticity in his upper back and legs after heavy activity.    He could not get his muscles to relax in his neck.   He also had psoriatic arthritis.   He had an MRI of the brain showing white matter foci.    He was referred to Dr. Anne Hahn.   He had an LP around 2002.   He believes it did not show any bands.  Dr. Anne Hahn sent him to Dr. Leotis Shames for a second opinion.    He was placed on Betaseron, then Rebif but had trouble tolerating them.   He started Copaxone and tolerated it well.   In 2012, an MRI showed some progression and he was switched to Gilenya.   He noted more bad days then good days and decided to switch medications.  He stopped Gilenya March 2019.   He feels the same off Gilenya as he did on the medication.    Ocrevus has been recommended and he is scheduled to hava his first split dose in a couple weeks.   He is not certain he wants to start Ocrevus.      He walks well most days but he feels weaker as he walks longer distances.   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    Strength is good.     He gets pain in his legs but has no limb numbness.  There is some burning  in his arms.  Sometimes the left face feels numb.    He is on gabapentin 300 mg po qid with benefit.   He notes muscle spasticity and does better on baclofen 10 mg 3-4 times a day.   A higher dose causes headache.    He was once on tizanidine but does not recall whether it helped.  He has urinary hesitancy since the 1980's.   He has been told the prostate was fine.    He was diagnosed with a 'shy bladder'.   Flomax had not helped.       His legs fatigue very easily, especially in hot weather.   He sleeps 6 hours or so most nights.   SOmetimes he gets hot flashes.   He snores some but his wife does not note any OSA like sounds or pauses.  He has a long history of depression and is on Pristiq 100 mg.   He tried Cymbalta and did better for a while but then the benefit stopped.      Cognitively, he notes poor focus and attention.         He has a lot of pain in his back and legs.   He takes Oxycodone 7.5 mg 4 times a day.    He has had lumbar surgery in the past by Dr. Otelia Sergeant in 2004 (either L4L5 or L5S1).    I personally reviewed the following imaging studies: MRI brain 08/16/2017:    Multiple T2/FLAIR hyperintense foci in the hemispheres, predominantly in the subcortical and deep white matter.  There are no classic periventricular foci.  No foci are noted in the infratentorial white matter.  Normal enhancement pattern.    Compared to the MRI dated 09/05/2010, minimal interval  change. MRI cervical 01/21/2015:   Spinal cord is normal.  Mild multilevel degenerative changes with no nerve root compression. MRI brain 09/05/2010:    Multiple T2/FLAIR hyperintense foci in the hemispheres, predominantly in the subcortical and deep white matter.  There are no classic periventricular foci.  No foci are noted in the infratentorial white matter.  Normal enhancement pattern.   Very Mild progression compared to 2003 MRI brain 03/03/2002:   Multiple T2/FLAIR hyperintense foci in the hemispheres, predominantly in the subcortical  and deep white matter.  There are no classic periventricular foci.  No foci are noted in the infratentorial white matter.  Normal enhancement pattern.  Vascular risks:   Does not smoke, has benign essential hypertension, is not diabetic, no cardiac history.     REVIEW OF SYSTEMS: Constitutional: No fevers, chills, sweats, or change in appetite Eyes: No visual changes, double vision, eye pain Ear, nose and throat: No hearing loss, ear pain, nasal congestion, sore throat Cardiovascular: No chest pain, palpitations Respiratory: No shortness of breath at rest or with exertion.   No wheezes GastrointestinaI: No nausea, vomiting, diarrhea, abdominal pain, fecal incontinence Genitourinary: No dysuria, urinary retention or frequency.  No nocturia. Musculoskeletal: No neck pain, back pain Integumentary: No rash, pruritus, skin lesions Neurological: as above Psychiatric: No depression at this time.  No anxiety Endocrine: No palpitations, diaphoresis, change in appetite, change in weigh or increased thirst Hematologic/Lymphatic: No anemia, purpura, petechiae. Allergic/Immunologic: No itchy/runny eyes, nasal congestion, recent allergic reactions, rashes  ALLERGIES: Allergies  Allergen Reactions   Sulfa Antibiotics    Adderall [Amphetamine-Dextroamphetamine]     Pt reports increase in headaches while on medication    HOME MEDICATIONS:  Current Outpatient Medications:    acidophilus (RISAQUAD) CAPS capsule, Take 1 capsule by mouth daily., Disp: , Rfl:    aspirin EC 81 MG tablet, Take 81 mg by mouth daily., Disp: , Rfl:    baclofen (LIORESAL) 10 MG tablet, Take 1/2-1 tablet by mouth three times daily, Disp: 90 each, Rfl: 3   buPROPion (WELLBUTRIN XL) 300 MG 24 hr tablet, Take 1 tablet (300 mg total) by mouth daily., Disp: 30 tablet, Rfl: 5   cholecalciferol (VITAMIN D) 1000 units tablet, Take 3,000 Units by mouth daily., Disp: , Rfl:    desvenlafaxine (PRISTIQ) 100 MG 24 hr tablet,  Take 1 1/2 pills x 2 weeks then increase to 2 pills po daily, Disp: 180 tablet, Rfl: 0   etodolac (LODINE) 400 MG tablet, Take 1 tablet (400 mg total) by mouth 2 (two) times daily., Disp: 60 tablet, Rfl: 5   gabapentin (NEURONTIN) 600 MG tablet, Take 1 tablet (600 mg total) by mouth 3 (three) times daily. (Patient taking differently: Take 900 mg by mouth 4 (four) times daily. ), Disp: 120 tablet, Rfl: 5   lisinopril (PRINIVIL,ZESTRIL) 10 MG tablet, Take 1 tablet by mouth daily., Disp: , Rfl:    oxyCODONE-acetaminophen (PERCOCET) 7.5-325 MG per tablet, Take 1 tablet by mouth 4 (four) times daily. , Disp: , Rfl:    polyethylene glycol (MIRALAX / GLYCOLAX) packet, Take 17 g by mouth daily as needed (constipation)., Disp: , Rfl:   PAST MEDICAL HISTORY: Past Medical History:  Diagnosis Date   Hypertension    Kidney stones    Migraine    Multiple sclerosis (HCC)    Vision abnormalities     PAST SURGICAL HISTORY: Past Surgical History:  Procedure Laterality Date   BACK SURGERY     FOOT SURGERY  I&D EXTREMITY Left 08/04/2016   Procedure: IRRIGATION AND DEBRIDEMENT EXTREMITY LEFT HAND;  Surgeon: Knute Neu, MD;  Location: [facility] OR;  Service: Plastics;  Laterality: Left;    FAMILY HISTORY: Family History  Problem Relation Age of Onset   Atrial fibrillation Mother    Dementia Mother    Heart disease Mother    Lung cancer Father     SOCIAL HISTORY:  Social History   Socioeconomic History   Marital status: Married    Spouse name: Not on file   Number of children: Not on file   Years of education: Not on file   Highest education level: Not on file  Occupational History   Not on file  Social Needs   Financial resource strain: Not on file   Food insecurity:    Worry: Not on file    Inability: Not on file   Transportation needs:    Medical: Not on file    Non-medical: Not on file  Tobacco Use   Smoking status: Never Smoker   Smokeless tobacco:  Never Used  Substance and Sexual Activity   Alcohol use: No   Drug use: No   Sexual activity: Not on file  Lifestyle   Physical activity:    Days per week: Not on file    Minutes per session: Not on file   Stress: Not on file  Relationships   Social connections:    Talks on phone: Not on file    Gets together: Not on file    Attends religious service: Not on file    Active member of club or organization: Not on file    Attends meetings of clubs or organizations: Not on file    Relationship status: Not on file   Intimate partner violence:    Fear of current or ex partner: Not on file    Emotionally abused: Not on file    Physically abused: Not on file    Forced sexual activity: Not on file  Other Topics Concern   Not on file  Social History Narrative   Not on file     PHYSICAL EXAM  There were no vitals filed for this visit.  There is no height or weight on file to calculate BMI.   General: The patient is well-developed and well-nourished and in no acute distress.    He has scaly skin changes on his hands and elbows.    Neck/Ext: He has neck tenderness paraspinal muscles with good range of motion. He has tenderness over many of the proximal muscles to deep palpation  Musculoskeletal:  Back is tender  Neurologic Exam  Mental status: The patient is alert and oriented x 3 at the time of the examination. The patient has apparent normal recent and remote memory, with mildly reduced attention span and mild word finding difficulties..  Cranial nerves: Extraocular movements are full.  Facial strength and sensation was normal.  Trapezius strength was normal.  The tongue is midline, and the patient has symmetric elevation of the soft palate. No obvious hearing deficits are noted.  Motor:  Muscle bulk is normal.   Tone is normal. Strength is  5 / 5 in all 4 extremities.   Sensory: He reported intact sensation to touch and vibration except for mild vibration asymmetry  at the knees.  Coordination: Finger-nose-finger and heel-to-shin is performed well.  Gait and station: Station is normal.   The gait is arthritic.  Tandem gait is mildly wide.  Romberg is negative.  Reflexes: Deep tendon reflexes are symmetric and normal bilaterally.      DIAGNOSTIC DATA (LABS, IMAGING, TESTING) - I reviewed patient records, labs, notes, testing and imaging myself where available.  Lab Results  Component Value Date   WBC 6.1 09/08/2018   HGB 15.7 09/08/2018   HCT 44.0 09/08/2018   MCV 88 09/08/2018   PLT 236 09/08/2018    Karlen Barbar A. Epimenio Foot, MD, PhD, FAAN Certified in Neurology, Clinical Neurophysiology, Sleep Medicine, Pain Medicine and Neuroimaging Director, [facility] at [facility]  [facility] [address], [address] [place], [place] [address] [phone]

## 2019-02-17 ENCOUNTER — Ambulatory Visit: Payer: Medicare Other | Admitting: Neurology

## 2019-03-17 ENCOUNTER — Encounter (INDEPENDENT_AMBULATORY_CARE_PROVIDER_SITE_OTHER): Payer: Self-pay | Admitting: *Deleted

## 2019-04-02 ENCOUNTER — Telehealth: Payer: Self-pay | Admitting: *Deleted

## 2019-04-02 MED ORDER — DESVENLAFAXINE SUCCINATE ER 100 MG PO TB24: 100.0000 mg | ORAL_TABLET | Freq: Every day | ORAL | 1 refills | Status: DC

## 2019-04-02 NOTE — Telephone Encounter (Signed)
Ok to refill 

## 2019-04-02 NOTE — Telephone Encounter (Signed)
escribed rx 

## 2019-04-02 NOTE — Telephone Encounter (Signed)
Called, LVM asking pt call office. We received fax request for refill on pristiq. However, Dr. Felecia Shelling last prescribed 04/2018. Has he been taking? Receiving refills from PCP?

## 2019-04-02 NOTE — Telephone Encounter (Signed)
Dr. Felecia Shelling- are you ok to refill pristiq?

## 2019-04-02 NOTE — Telephone Encounter (Signed)
He states he has been taking it every month and he is unsure of who has been refilling it , he just called [facility] for refills

## 2019-07-13 ENCOUNTER — Telehealth: Payer: Self-pay | Admitting: Neurology

## 2019-07-13 NOTE — Telephone Encounter (Signed)
Amber with [place] dermatology office called to speak to provider in regards to starting the patient on a Biological medicine for psoriasis. Please follow up

## 2019-07-13 NOTE — Telephone Encounter (Signed)
I returned call and was advised Amber had left for the day and would return on 07/14/2019. I left vm asking amber to call back when she can.

## 2019-07-14 ENCOUNTER — Telehealth: Payer: Self-pay | Admitting: Neurology

## 2019-07-14 NOTE — Telephone Encounter (Signed)
I spoke to [place], [place] with dermatology.  She asked if he could be placed on either Cosentyx or Stelara for his psoriasis.  He has possible MS.  Off of a disease modifying therapy, there is no progression.  The pattern of the MRI lesions is mostly nonspecific and I feel there is only a 20 to 25% chance that he has MS.   therefore, I would want him to avoid an anti-TNF agent.  Cosentyx was actually tried in a phase 2 study and showed some benefit in MS.  Stelara did not really show much activity either way.  Therefore, either of these agents would be safe for him to use.

## 2019-07-14 NOTE — Telephone Encounter (Signed)
Amber, PA with Dr. Juel Burrow office returned my call. She wanted to confirm if the pt was currently on any DMT? I reviewed the last note from 01/14/2019 and confirmed the pt was not on any DMT.   Amber also wanted to confirm if Dr. Felecia Shelling would be agreeable with the pt starting Stelara or Cosentyx?   Amber, PA wanted to know if Dr. Felecia Shelling would be wiling to call her at [phone] to further discuss?

## 2019-07-27 ENCOUNTER — Other Ambulatory Visit: Payer: Self-pay | Admitting: *Deleted

## 2019-07-27 MED ORDER — BUPROPION HCL ER (XL) 300 MG PO TB24: 300.0000 mg | ORAL_TABLET | Freq: Every day | ORAL | 5 refills | Status: DC

## 2019-08-05 ENCOUNTER — Telehealth: Payer: Self-pay | Admitting: Neurology

## 2019-08-05 MED ORDER — GABAPENTIN 600 MG PO TABS: 600.0000 mg | ORAL_TABLET | Freq: Three times a day (TID) | ORAL | 2 refills | Status: DC

## 2019-08-05 NOTE — Telephone Encounter (Signed)
E-scribed rx as requested. 

## 2019-08-05 NOTE — Telephone Encounter (Signed)
Yes we can send in another 3 months

## 2019-08-05 NOTE — Telephone Encounter (Signed)
Pt is calling for his refill on his  gabapentin (NEURONTIN) 600 MG tablet  [facility] - [place], [place]

## 2019-08-05 NOTE — Addendum Note (Signed)
Addended by: Rossie Muskrat L on: 08/05/2019 01:10 PM   Modules accepted: Orders

## 2019-08-06 MED ORDER — GABAPENTIN 300 MG PO CAPS: ORAL_CAPSULE | ORAL | 2 refills | Status: DC

## 2019-08-06 NOTE — Addendum Note (Signed)
Addended by: Hope Pigeon on: 08/06/2019 03:52 PM   Modules accepted: Orders

## 2019-08-06 NOTE — Telephone Encounter (Signed)
Okay to prescribe the higher dose (900 mg 4 times a day) instead of the 600 mg 4 times a day dose.

## 2019-08-06 NOTE — Telephone Encounter (Signed)
Dr. Felecia Shelling- ok to prescribe higher dose?

## 2019-08-06 NOTE — Telephone Encounter (Signed)
Escribed updated rx and asked pharmacy to d/c all other gabapentin rx's on file. I called pt to let him know we updated rx for him. He verbalized understanding.

## 2019-08-06 NOTE — Telephone Encounter (Signed)
Patient called in regards to his gabapentin states that he was prescribed a higher dose by Dr.douglas jeffrey and was wanting to know what prescription he should be taking or how it would affect him to begin the new dosage.  States the old prescription from [facility] was Gabapentin 300mg   3 capsules 4 times a day daily  Please follow up.

## 2019-08-17 ENCOUNTER — Other Ambulatory Visit: Payer: Self-pay | Admitting: Neurology

## 2019-09-02 ENCOUNTER — Other Ambulatory Visit: Payer: Self-pay | Admitting: Neurology

## 2019-09-07 DIAGNOSIS — Z79899 Other long term (current) drug therapy: Secondary | ICD-10-CM | POA: Diagnosis not present

## 2019-09-07 DIAGNOSIS — L400 Psoriasis vulgaris: Secondary | ICD-10-CM | POA: Diagnosis not present

## 2019-09-30 ENCOUNTER — Other Ambulatory Visit: Payer: Self-pay

## 2019-09-30 ENCOUNTER — Encounter: Payer: Self-pay | Admitting: Neurology

## 2019-09-30 ENCOUNTER — Ambulatory Visit: Payer: Medicare PPO | Admitting: Neurology

## 2019-09-30 VITALS — BP 117/74 | HR 71 | Temp 97.7°F | Ht 71.0 in | Wt 202.5 lb

## 2019-09-30 DIAGNOSIS — M7551 Bursitis of right shoulder: Secondary | ICD-10-CM | POA: Diagnosis not present

## 2019-09-30 DIAGNOSIS — F988 Other specified behavioral and emotional disorders with onset usually occurring in childhood and adolescence: Secondary | ICD-10-CM

## 2019-09-30 DIAGNOSIS — L409 Psoriasis, unspecified: Secondary | ICD-10-CM

## 2019-09-30 DIAGNOSIS — R9082 White matter disease, unspecified: Secondary | ICD-10-CM

## 2019-09-30 DIAGNOSIS — F418 Other specified anxiety disorders: Secondary | ICD-10-CM | POA: Diagnosis not present

## 2019-09-30 DIAGNOSIS — R5383 Other fatigue: Secondary | ICD-10-CM | POA: Diagnosis not present

## 2019-09-30 DIAGNOSIS — M7552 Bursitis of left shoulder: Secondary | ICD-10-CM

## 2019-09-30 MED ORDER — GABAPENTIN 800 MG PO TABS: 800.0000 mg | ORAL_TABLET | Freq: Four times a day (QID) | ORAL | 11 refills | Status: DC

## 2019-09-30 NOTE — Progress Notes (Signed)
[facility]  PATIENT: Paul Pope DOB: 04-19-1956  REFERRING DOCTOR OR PCP:  Benita Stabile SOURCE: Patient, notes from Dr. Margo Aye, multiple laboratory and MRI reports, multiple MRIs on PACS personally reviewed.  _________________________________   HISTORICAL  CHIEF COMPLAINT:  Chief Complaint  Patient presents with  . Follow-up    RM 12, alone. Last seen 01/14/2019. Strength in his arms decreased, increased pain in shoulders, increased weakness/pain in legs. He fell last outside in his driveway about a month ago or more, he tripped over something. He gets very fatigued easliy.     HISTORY OF PRESENT ILLNESS:  Paul Pope is a 64 yo man who has white matter foci on MRI and was diagnosed with MS in the past.   His main issues are fatigue and pain.    He also has psoriasis.   Update 09/30/2019: He has white matter foci and had been diagnosed with MS in the past.   However, I felt the MRI findings are nonspecific, mostly in the deep white matter and there are none in the infratentorial white matter or spinal cord, they are more likely to represent chronic microvascular ischemic change.  I discussed that  cannot completely rule out demyelination but feel this is less likely.     I discussed medicatins with dermatology and Cosentyx or Stelara for psoriasis would be ok with his low chance of MS.   I would advise avoiding one of the TNF agents as still a small possibility of MS.    He reports continued pain.  The worst pain is in the shoulders, left greater than right and he also has pain in the neck.  Headaches occur when neck hurts more and feels stiff.  He has fatigue and poor focus.    He has tried Adderall but his headaches worsened.    Update 01/14/2019 (virtual):   He had been diagnosed with MS in the past.    He also has psoriasis.  He had been on multiple DMT's in the past, most recently Gilenya but he stopped early 2019.   I had him try Leflunomide as it may help  psoriasis and be useful if he indeed has MS.    However, he had GI upset and needed to stop after just a few pills.    A repeat MRI 10/22/2018 (brain and spine) was unchanged compared to 2018.   I personally reviewed the MRIs at today's visits changes are mostly in the deep white matter.   No infratentorial or cervical spine foci.  There are mild degenerative changes but no spinal stenosis.  He feels he loses strength in his legs and they feel heavy like wading through water.  Leg strength is about the same in both legs.   He notes spasms in his legs, especially at night.  They occur mostly in the calves.  He notes some fatigue.    He denies numbness.   Bladder function is about the same with no major changes.   Vision is about the same.    Fatigue is about the same as last visit.  It bothers him more in the afternoons than mornings.   Sleep is poor many nights (more trouble falling asleep than staying asleep). Sleep was very poor years ago but he actually slept better for part of 2019 and very early 2020.   He has anxiety and depression.   He had more depression when Wellbutrin was reduced from 300 mg to 150 mg and we  reinstated the higher dose (just started today).     He has psoriasis and a tanning bed was recommended.   His skin is doing better.     Update 09/08/2018: He remains off of a disease modifying therapy for his MS by his choice.  He has been on multiple disease modifying therapies in the past including Rebif, Copaxone and Gilenya.  He stopped Gilenya in March 2019.   His MRI 2018 showed mostly   He denies weakness in the legs.  He feels his gait is okay though he feels weaker with his walking when he goes longer distances.   He has spasticity but can only tolerate low dose of baclofen 5 mg 2-3 times a day.    He denies numbness or dysesthesia.  He has some urinary hesitancy for 30 years that had not improved with Flomax in the past and has not worsened.  Vision is fine.  He has atypical  trigeminal neuralgia with left facial pain.  He has numbness in the left face.   He always has discomfort with superimposed neuralgic shooting pain.    He once tried Tegretol but it was poorly tolerated.    He has pain and muscle tightness in the neck and shoulders.    His main problem has been fatigue that is physical more than cognitive.  Fatigue is always worse in the heat.  He also notes a lot of apathy.  He has had some depression and is on Pristiq.   He is not sure  Update 05/07/2018: He has noted some fluctuations in how he feels.   His main problem is fatigue.  Fatigue is both physical and cognitive.   He feels worse with heat.   He feels apathetic.       He sleeps well most nights.   He feels more forgetful as well.   He has had left facial pain.  Pain was constant not shocklike.   He takes gabapentin 900 mg x 4 for pain and also takes oxycodone.  He feels his gait is doing okay.  He denies weakness though he tires out easily.  He has dysesthesias.  Bladder function is fine..  At his initial visit, Adderall XR 10 mg was started.   However, in combination with the Wellbutrin he felt palpitations and had insomnia.     Wellbutrin also caused headaches.    He saw Rheumatology and was told he had psoriasis but not psoriatic arthritis.     From 02/11/2018:. He began to experience migraines in the 1990's.   He was diagnosed with MS in 2002 after presenting with spasticity in his upper back and legs after heavy activity.    He could not get his muscles to relax in his neck.   He also had psoriatic arthritis.   He had an MRI of the brain showing white matter foci.    He was referred to Dr. Anne Hahn.   He had an LP around 2002.   He believes it did not show any bands.  Dr. Anne Hahn sent him to Dr. Leotis Shames for a second opinion.    He was placed on Betaseron, then Rebif but had trouble tolerating them.   He started Copaxone and tolerated it well.   In 2012, an MRI showed some progression and he was switched to  Gilenya.   He noted more bad days then good days and decided to switch medications.  He stopped Gilenya March 2019.   He feels the same off Gilenya  as he did on the medication.    Ocrevus has been recommended and he is scheduled to hava his first split dose in a couple weeks.   He is not certain he wants to start Ocrevus.      He walks well most days but he feels weaker as he walks longer distances.   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    Strength is good.     He gets pain in his legs but has no limb numbness.  There is some burning in his arms.  Sometimes the left face feels numb.    He is on gabapentin 300 mg po qid with benefit.   He notes muscle spasticity and does better on baclofen 10 mg 3-4 times a day.   A higher dose causes headache.    He was once on tizanidine but does not recall whether it helped.  He has urinary hesitancy since the 1980's.   He has been told the prostate was fine.    He was diagnosed with a 'shy bladder'.   Flomax had not helped.       His legs fatigue very easily, especially in hot weather.   He sleeps 6 hours or so most nights.   SOmetimes he gets hot flashes.   He snores some but his wife does not note any OSA like sounds or pauses.  He has a long history of depression and is on Pristiq 100 mg.   He tried Cymbalta and did better for a while but then the benefit stopped.      Cognitively, he notes poor focus and attention.         He has a lot of pain in his back and legs.   He takes Oxycodone 7.5 mg 4 times a day.    He has had lumbar surgery in the past by Dr. Otelia Sergeant in 2004 (either L4L5 or L5S1).    I personally reviewed the following imaging studies: MRI brain 08/16/2017:    Multiple T2/FLAIR hyperintense foci in the hemispheres, predominantly in the subcortical and deep white matter.  There are no classic periventricular foci.  No foci are noted in the infratentorial white matter.  Normal enhancement pattern.    Compared to the MRI dated 09/05/2010, minimal  interval change. MRI cervical 01/21/2015:   Spinal cord is normal.  Mild multilevel degenerative changes with no nerve root compression. MRI brain 09/05/2010:    Multiple T2/FLAIR hyperintense foci in the hemispheres, predominantly in the subcortical and deep white matter.  There are no classic periventricular foci.  No foci are noted in the infratentorial white matter.  Normal enhancement pattern.   Very Mild progression compared to 2003 MRI brain 03/03/2002:   Multiple T2/FLAIR hyperintense foci in the hemispheres, predominantly in the subcortical and deep white matter.  There are no classic periventricular foci.  No foci are noted in the infratentorial white matter.  Normal enhancement pattern.  Vascular risks:   Does not smoke, has benign essential hypertension, is not diabetic, no cardiac history.     REVIEW OF SYSTEMS: Constitutional: No fevers, chills, sweats, or change in appetite Eyes: No visual changes, double vision, eye pain Ear, nose and throat: No hearing loss, ear pain, nasal congestion, sore throat Cardiovascular: No chest pain, palpitations Respiratory: No shortness of breath at rest or with exertion.   No wheezes GastrointestinaI: No nausea, vomiting, diarrhea, abdominal pain, fecal incontinence Genitourinary: No dysuria, urinary retention or frequency.  No nocturia. Musculoskeletal:  No neck pain, back pain Integumentary: No rash, pruritus, skin lesions Neurological: as above Psychiatric: No depression at this time.  No anxiety Endocrine: No palpitations, diaphoresis, change in appetite, change in weigh or increased thirst Hematologic/Lymphatic: No anemia, purpura, petechiae. Allergic/Immunologic: No itchy/runny eyes, nasal congestion, recent allergic reactions, rashes  ALLERGIES: Allergies  Allergen Reactions  . Sulfa Antibiotics   . Adderall [Amphetamine-Dextroamphetamine]     Pt reports increase in headaches while on medication    HOME MEDICATIONS:  Current  Outpatient Medications:  .  acidophilus (RISAQUAD) CAPS capsule, Take 1 capsule by mouth daily., Disp: , Rfl:  .  aspirin EC 81 MG tablet, Take 81 mg by mouth daily., Disp: , Rfl:  .  baclofen (LIORESAL) 10 MG tablet, Take 1/2-1 tablet by mouth three times daily (Patient taking differently: Take 20 mg by mouth. Takes 2-3 times per day), Disp: 90 each, Rfl: 3 .  buPROPion (WELLBUTRIN XL) 300 MG 24 hr tablet, Take 1 tablet (300 mg total) by mouth daily., Disp: 30 tablet, Rfl: 5 .  cholecalciferol (VITAMIN D) 1000 units tablet, Take 3,000 Units by mouth daily., Disp: , Rfl:  .  desvenlafaxine (PRISTIQ) 100 MG 24 hr tablet, Take 1 tablet (100 mg total) by mouth daily., Disp: 90 tablet, Rfl: 1 .  etodolac (LODINE) 400 MG tablet, TAKE (1) TABLET TWICE DAILY., Disp: 60 tablet, Rfl: 1 .  lisinopril (PRINIVIL,ZESTRIL) 10 MG tablet, Take 1 tablet by mouth daily., Disp: , Rfl:  .  oxyCODONE-acetaminophen (PERCOCET) 10-325 MG tablet, Take 4-5 tablets by mouth daily., Disp: , Rfl:  .  polyethylene glycol (MIRALAX / GLYCOLAX) packet, Take 17 g by mouth daily as needed (constipation)., Disp: , Rfl:  .  gabapentin (NEURONTIN) 800 MG tablet, Take 1 tablet (800 mg total) by mouth 4 (four) times daily., Disp: 120 tablet, Rfl: 11  PAST MEDICAL HISTORY: Past Medical History:  Diagnosis Date  . Hypertension   . Kidney stones   . Migraine   . Multiple sclerosis ([place])   . Vision abnormalities     PAST SURGICAL HISTORY: Past Surgical History:  Procedure Laterality Date  . BACK SURGERY    . FOOT SURGERY    . I & D EXTREMITY Left 08/04/2016   Procedure: IRRIGATION AND DEBRIDEMENT EXTREMITY LEFT HAND;  Surgeon: Dayna Barker, MD;  Location: [facility];  Service: Plastics;  Laterality: Left;    FAMILY HISTORY: Family History  Problem Relation Age of Onset  . Atrial fibrillation Mother   . Dementia Mother   . Heart disease Mother   . Lung cancer Father     SOCIAL HISTORY:  Social History   Socioeconomic  History  . Marital status: Married    Spouse name: Not on file  . Number of children: Not on file  . Years of education: Not on file  . Highest education level: Not on file  Occupational History  . Not on file  Tobacco Use  . Smoking status: Never Smoker  . Smokeless tobacco: Never Used  Substance and Sexual Activity  . Alcohol use: No  . Drug use: No  . Sexual activity: Not on file  Other Topics Concern  . Not on file  Social History Narrative  . Not on file   Social Determinants of Health   Financial Resource Strain:   . Difficulty of Paying Living Expenses: Not on file  Food Insecurity:   . Worried About Charity fundraiser in the Last Year: Not on file  . Ran  Out of Food in the Last Year: Not on file  Transportation Needs:   . Lack of Transportation (Medical): Not on file  . Lack of Transportation (Non-Medical): Not on file  Physical Activity:   . Days of Exercise per Week: Not on file  . Minutes of Exercise per Session: Not on file  Stress:   . Feeling of Stress : Not on file  Social Connections:   . Frequency of Communication with Friends and Family: Not on file  . Frequency of Social Gatherings with Friends and Family: Not on file  . Attends Religious Services: Not on file  . Active Member of Clubs or Organizations: Not on file  . Attends Banker Meetings: Not on file  . Marital Status: Not on file  Intimate Partner Violence:   . Fear of Current or Ex-Partner: Not on file  . Emotionally Abused: Not on file  . Physically Abused: Not on file  . Sexually Abused: Not on file     PHYSICAL EXAM  Vitals:   09/30/19 1527  BP: 117/74  Pulse: 71  Temp: 97.7 F (36.5 C)  Weight: 202 lb 8 oz (91.9 kg)  Height: 5\' 11"  (1.803 m)    Body mass index is 28.24 kg/m.   General: The patient is well-developed and well-nourished and in no acute distress.    He has scaly skin changes on his hands and elbows.    Neck/Ext: He has moderate tenderness over  the subacromial bursae, left greater than right.  He has reduced range of motion of the left shoulder.  There is moderate tenderness over the paraspinal muscles.  Musculoskeletal:  Back is tender  Neurologic Exam  Mental status: The patient is alert and oriented x 3 at the time of the examination. The patient has apparent normal recent and remote memory, with mildly reduced attention span and mild word finding difficulties..  Cranial nerves: Extraocular movements are full.  Facial strength and sensation was normal.  Trapezius strength was normal.  The tongue is midline, and the patient has symmetric elevation of the soft palate. No obvious hearing deficits are noted.  Motor:  Muscle bulk is normal.   Tone is normal. Strength is  5 / 5 in all 4 extremities.   Sensory: He reported intact sensation to touch and vibration except for mild vibration asymmetry at the knees.  Coordination: Finger-nose-finger and heel-to-shin is performed well.  Gait and station: Station is normal.   Gait is arthritic.  Tandem gait is mildly wide.  Romberg is negative. Reflexes: Deep tendon reflexes are symmetric and normal bilaterally.      DIAGNOSTIC DATA (LABS, IMAGING, TESTING) - I reviewed patient records, labs, notes, testing and imaging myself where available.  Lab Results  Component Value Date   WBC 6.1 09/08/2018   HGB 15.7 09/08/2018   HCT 44.0 09/08/2018   MCV 88 09/08/2018   PLT 236 09/08/2018    White matter abnormality on MRI of brain - Plan: MR BRAIN W WO CONTRAST  Psoriasis  Depression with anxiety  Attention deficit disorder, unspecified hyperactivity presence  Other fatigue - Plan: MR BRAIN W WO CONTRAST  Subacromial bursitis of both shoulders  1.   Bilateral subacromial bursa injections with a total of 80 mg Depo-Medrol in 4 cc Marcaine using sterile technique.   He tolerated the procedure wlel and no complication. 2.   We discussed that the white matter changes are more  consistent with chronic microvascular ischemic change than with demyelination.  Therefore, I think it is more likely that he does not have MS.  However, we need to check an additional MRI of the brain while he remains off of disease modifying therapies.  The likelihood of MS becomes even smaller if this MRI is unchanged. 3.   He will return to see me as needed based on the results of the studies.   Tamanna Whitson A. Epimenio Foot, MD, PhD, FAAN Certified in Neurology, Clinical Neurophysiology, Sleep Medicine, Pain Medicine and Neuroimaging Director, [facility]  [facility] [address] [place], [place] [address] [phone]

## 2019-10-07 DIAGNOSIS — L400 Psoriasis vulgaris: Secondary | ICD-10-CM | POA: Diagnosis not present

## 2019-10-12 ENCOUNTER — Telehealth: Payer: Self-pay | Admitting: *Deleted

## 2019-10-12 NOTE — Telephone Encounter (Signed)
Received PA request for gabapentin 300mg  cap. Dx: M79.2, G50.1. . Submitted but received message from [facility] that it is available without authorization.

## 2019-11-04 DIAGNOSIS — H524 Presbyopia: Secondary | ICD-10-CM | POA: Diagnosis not present

## 2019-11-07 ENCOUNTER — Other Ambulatory Visit: Payer: Medicare Other

## 2019-11-16 ENCOUNTER — Other Ambulatory Visit: Payer: Self-pay | Admitting: Neurology

## 2019-11-17 DIAGNOSIS — M47816 Spondylosis without myelopathy or radiculopathy, lumbar region: Secondary | ICD-10-CM | POA: Diagnosis not present

## 2019-11-17 DIAGNOSIS — M961 Postlaminectomy syndrome, not elsewhere classified: Secondary | ICD-10-CM | POA: Diagnosis not present

## 2019-11-17 DIAGNOSIS — G894 Chronic pain syndrome: Secondary | ICD-10-CM | POA: Diagnosis not present

## 2019-11-17 DIAGNOSIS — Z79891 Long term (current) use of opiate analgesic: Secondary | ICD-10-CM | POA: Diagnosis not present

## 2020-01-14 DIAGNOSIS — Z79891 Long term (current) use of opiate analgesic: Secondary | ICD-10-CM | POA: Diagnosis not present

## 2020-01-14 DIAGNOSIS — G894 Chronic pain syndrome: Secondary | ICD-10-CM | POA: Diagnosis not present

## 2020-01-14 DIAGNOSIS — M47816 Spondylosis without myelopathy or radiculopathy, lumbar region: Secondary | ICD-10-CM | POA: Diagnosis not present

## 2020-01-14 DIAGNOSIS — M961 Postlaminectomy syndrome, not elsewhere classified: Secondary | ICD-10-CM | POA: Diagnosis not present

## 2020-02-08 DIAGNOSIS — F331 Major depressive disorder, recurrent, moderate: Secondary | ICD-10-CM | POA: Diagnosis not present

## 2020-02-08 DIAGNOSIS — E782 Mixed hyperlipidemia: Secondary | ICD-10-CM | POA: Diagnosis not present

## 2020-02-08 DIAGNOSIS — E559 Vitamin D deficiency, unspecified: Secondary | ICD-10-CM | POA: Diagnosis not present

## 2020-02-08 DIAGNOSIS — F39 Unspecified mood [affective] disorder: Secondary | ICD-10-CM | POA: Diagnosis not present

## 2020-02-08 DIAGNOSIS — G35 Multiple sclerosis: Secondary | ICD-10-CM | POA: Diagnosis not present

## 2020-02-08 DIAGNOSIS — E291 Testicular hypofunction: Secondary | ICD-10-CM | POA: Diagnosis not present

## 2020-02-08 DIAGNOSIS — F332 Major depressive disorder, recurrent severe without psychotic features: Secondary | ICD-10-CM | POA: Diagnosis not present

## 2020-02-08 DIAGNOSIS — F321 Major depressive disorder, single episode, moderate: Secondary | ICD-10-CM | POA: Diagnosis not present

## 2020-02-08 DIAGNOSIS — F322 Major depressive disorder, single episode, severe without psychotic features: Secondary | ICD-10-CM | POA: Diagnosis not present

## 2020-02-24 DIAGNOSIS — G35 Multiple sclerosis: Secondary | ICD-10-CM | POA: Diagnosis not present

## 2020-02-24 DIAGNOSIS — L409 Psoriasis, unspecified: Secondary | ICD-10-CM | POA: Diagnosis not present

## 2020-02-24 DIAGNOSIS — F331 Major depressive disorder, recurrent, moderate: Secondary | ICD-10-CM | POA: Diagnosis not present

## 2020-02-24 DIAGNOSIS — I10 Essential (primary) hypertension: Secondary | ICD-10-CM | POA: Diagnosis not present

## 2020-02-24 DIAGNOSIS — Z6828 Body mass index (BMI) 28.0-28.9, adult: Secondary | ICD-10-CM | POA: Diagnosis not present

## 2020-02-24 DIAGNOSIS — F332 Major depressive disorder, recurrent severe without psychotic features: Secondary | ICD-10-CM | POA: Diagnosis not present

## 2020-02-24 DIAGNOSIS — G894 Chronic pain syndrome: Secondary | ICD-10-CM | POA: Diagnosis not present

## 2020-02-24 DIAGNOSIS — E782 Mixed hyperlipidemia: Secondary | ICD-10-CM | POA: Diagnosis not present

## 2020-02-24 DIAGNOSIS — E663 Overweight: Secondary | ICD-10-CM | POA: Diagnosis not present

## 2020-03-10 DIAGNOSIS — G894 Chronic pain syndrome: Secondary | ICD-10-CM | POA: Diagnosis not present

## 2020-03-10 DIAGNOSIS — M47816 Spondylosis without myelopathy or radiculopathy, lumbar region: Secondary | ICD-10-CM | POA: Diagnosis not present

## 2020-03-10 DIAGNOSIS — Z79891 Long term (current) use of opiate analgesic: Secondary | ICD-10-CM | POA: Diagnosis not present

## 2020-03-10 DIAGNOSIS — M961 Postlaminectomy syndrome, not elsewhere classified: Secondary | ICD-10-CM | POA: Diagnosis not present

## 2020-04-29 ENCOUNTER — Other Ambulatory Visit: Payer: Self-pay | Admitting: Neurology

## 2020-05-10 DIAGNOSIS — M47816 Spondylosis without myelopathy or radiculopathy, lumbar region: Secondary | ICD-10-CM | POA: Diagnosis not present

## 2020-05-10 DIAGNOSIS — M961 Postlaminectomy syndrome, not elsewhere classified: Secondary | ICD-10-CM | POA: Diagnosis not present

## 2020-05-10 DIAGNOSIS — Z79891 Long term (current) use of opiate analgesic: Secondary | ICD-10-CM | POA: Diagnosis not present

## 2020-05-10 DIAGNOSIS — G894 Chronic pain syndrome: Secondary | ICD-10-CM | POA: Diagnosis not present

## 2020-07-05 DIAGNOSIS — G894 Chronic pain syndrome: Secondary | ICD-10-CM | POA: Diagnosis not present

## 2020-07-05 DIAGNOSIS — M47816 Spondylosis without myelopathy or radiculopathy, lumbar region: Secondary | ICD-10-CM | POA: Diagnosis not present

## 2020-07-05 DIAGNOSIS — Z79891 Long term (current) use of opiate analgesic: Secondary | ICD-10-CM | POA: Diagnosis not present

## 2020-07-05 DIAGNOSIS — M961 Postlaminectomy syndrome, not elsewhere classified: Secondary | ICD-10-CM | POA: Diagnosis not present

## 2020-07-12 DIAGNOSIS — L400 Psoriasis vulgaris: Secondary | ICD-10-CM | POA: Diagnosis not present

## 2020-07-13 ENCOUNTER — Other Ambulatory Visit: Payer: Self-pay | Admitting: Neurology

## 2020-08-23 ENCOUNTER — Ambulatory Visit: Payer: Medicare PPO | Admitting: Neurology

## 2020-08-29 DIAGNOSIS — G35 Multiple sclerosis: Secondary | ICD-10-CM | POA: Diagnosis not present

## 2020-08-29 DIAGNOSIS — Z0000 Encounter for general adult medical examination without abnormal findings: Secondary | ICD-10-CM | POA: Diagnosis not present

## 2020-08-29 DIAGNOSIS — R5383 Other fatigue: Secondary | ICD-10-CM | POA: Diagnosis not present

## 2020-08-29 DIAGNOSIS — E782 Mixed hyperlipidemia: Secondary | ICD-10-CM | POA: Diagnosis not present

## 2020-08-29 DIAGNOSIS — Z0001 Encounter for general adult medical examination with abnormal findings: Secondary | ICD-10-CM | POA: Diagnosis not present

## 2020-08-29 DIAGNOSIS — I10 Essential (primary) hypertension: Secondary | ICD-10-CM | POA: Diagnosis not present

## 2020-08-29 DIAGNOSIS — F39 Unspecified mood [affective] disorder: Secondary | ICD-10-CM | POA: Diagnosis not present

## 2020-08-29 DIAGNOSIS — E559 Vitamin D deficiency, unspecified: Secondary | ICD-10-CM | POA: Diagnosis not present

## 2020-08-29 DIAGNOSIS — E291 Testicular hypofunction: Secondary | ICD-10-CM | POA: Diagnosis not present

## 2020-08-30 DIAGNOSIS — G894 Chronic pain syndrome: Secondary | ICD-10-CM | POA: Diagnosis not present

## 2020-08-30 DIAGNOSIS — Z79891 Long term (current) use of opiate analgesic: Secondary | ICD-10-CM | POA: Diagnosis not present

## 2020-08-30 DIAGNOSIS — M47816 Spondylosis without myelopathy or radiculopathy, lumbar region: Secondary | ICD-10-CM | POA: Diagnosis not present

## 2020-08-30 DIAGNOSIS — M961 Postlaminectomy syndrome, not elsewhere classified: Secondary | ICD-10-CM | POA: Diagnosis not present

## 2020-09-19 DIAGNOSIS — E782 Mixed hyperlipidemia: Secondary | ICD-10-CM | POA: Diagnosis not present

## 2020-09-19 DIAGNOSIS — E663 Overweight: Secondary | ICD-10-CM | POA: Diagnosis not present

## 2020-09-19 DIAGNOSIS — G35 Multiple sclerosis: Secondary | ICD-10-CM | POA: Diagnosis not present

## 2020-09-19 DIAGNOSIS — F332 Major depressive disorder, recurrent severe without psychotic features: Secondary | ICD-10-CM | POA: Diagnosis not present

## 2020-09-19 DIAGNOSIS — Z6831 Body mass index (BMI) 31.0-31.9, adult: Secondary | ICD-10-CM | POA: Diagnosis not present

## 2020-09-19 DIAGNOSIS — L409 Psoriasis, unspecified: Secondary | ICD-10-CM | POA: Diagnosis not present

## 2020-09-19 DIAGNOSIS — B000 Eczema herpeticum: Secondary | ICD-10-CM | POA: Diagnosis not present

## 2020-09-19 DIAGNOSIS — I10 Essential (primary) hypertension: Secondary | ICD-10-CM | POA: Diagnosis not present

## 2020-09-19 DIAGNOSIS — G894 Chronic pain syndrome: Secondary | ICD-10-CM | POA: Diagnosis not present

## 2020-10-25 DIAGNOSIS — Z79899 Other long term (current) drug therapy: Secondary | ICD-10-CM | POA: Diagnosis not present

## 2020-10-25 DIAGNOSIS — L400 Psoriasis vulgaris: Secondary | ICD-10-CM | POA: Diagnosis not present

## 2020-10-25 DIAGNOSIS — Z79891 Long term (current) use of opiate analgesic: Secondary | ICD-10-CM | POA: Diagnosis not present

## 2020-10-25 DIAGNOSIS — M961 Postlaminectomy syndrome, not elsewhere classified: Secondary | ICD-10-CM | POA: Diagnosis not present

## 2020-10-25 DIAGNOSIS — M47816 Spondylosis without myelopathy or radiculopathy, lumbar region: Secondary | ICD-10-CM | POA: Diagnosis not present

## 2020-10-25 DIAGNOSIS — G894 Chronic pain syndrome: Secondary | ICD-10-CM | POA: Diagnosis not present

## 2020-10-27 ENCOUNTER — Other Ambulatory Visit: Payer: Self-pay | Admitting: Neurology

## 2020-10-31 ENCOUNTER — Telehealth: Payer: Self-pay | Admitting: Neurology

## 2020-10-31 MED ORDER — GABAPENTIN 800 MG PO TABS: 800.0000 mg | ORAL_TABLET | Freq: Four times a day (QID) | ORAL | 0 refills | Status: AC

## 2020-10-31 NOTE — Telephone Encounter (Signed)
Dr. Epimenio Foot approved refill until appt. I e-scribed rx to [facility]

## 2020-10-31 NOTE — Telephone Encounter (Signed)
Pt request refill gabapentin (NEURONTIN) 800 MG tablet at [facility].  Office visit scheduled 11/09/20

## 2020-10-31 NOTE — Telephone Encounter (Signed)
Called pt back. Advised he was last seen by Dr. Epimenio Foot on 09/30/19 and advised to follow up with his as needed. Pt has not asked PCP for refill on gabapentin. He would like to still keep appt scheduled for 11/09/20 to see Dr. Epimenio Foot. He is having worsening memory issues, spasms, increased pain. I did make him aware Dr. Epimenio Foot does not manage chronic pain. He is aware, has pain doctor: Dr. Vear Clock.  Advised I will speak with MD to see if we can send in refill of gabapentin until he sees him. I will only call back if we cannot. He verbalized understanding.

## 2020-11-09 ENCOUNTER — Ambulatory Visit: Payer: Medicare PPO | Admitting: Neurology

## 2020-11-19 ENCOUNTER — Other Ambulatory Visit: Payer: Self-pay | Admitting: Neurology

## 2020-11-21 DIAGNOSIS — I10 Essential (primary) hypertension: Secondary | ICD-10-CM | POA: Diagnosis not present

## 2020-11-21 DIAGNOSIS — E782 Mixed hyperlipidemia: Secondary | ICD-10-CM | POA: Diagnosis not present

## 2020-11-21 DIAGNOSIS — G894 Chronic pain syndrome: Secondary | ICD-10-CM | POA: Diagnosis not present

## 2020-11-21 DIAGNOSIS — L409 Psoriasis, unspecified: Secondary | ICD-10-CM | POA: Diagnosis not present

## 2020-11-21 DIAGNOSIS — G35 Multiple sclerosis: Secondary | ICD-10-CM | POA: Diagnosis not present

## 2020-11-21 DIAGNOSIS — E663 Overweight: Secondary | ICD-10-CM | POA: Diagnosis not present

## 2020-11-21 DIAGNOSIS — Z6831 Body mass index (BMI) 31.0-31.9, adult: Secondary | ICD-10-CM | POA: Diagnosis not present

## 2020-11-21 DIAGNOSIS — F332 Major depressive disorder, recurrent severe without psychotic features: Secondary | ICD-10-CM | POA: Diagnosis not present

## 2020-11-21 DIAGNOSIS — B000 Eczema herpeticum: Secondary | ICD-10-CM | POA: Diagnosis not present

## 2020-12-12 DIAGNOSIS — F332 Major depressive disorder, recurrent severe without psychotic features: Secondary | ICD-10-CM | POA: Diagnosis not present

## 2020-12-12 DIAGNOSIS — L409 Psoriasis, unspecified: Secondary | ICD-10-CM | POA: Diagnosis not present

## 2020-12-12 DIAGNOSIS — G35 Multiple sclerosis: Secondary | ICD-10-CM | POA: Diagnosis not present

## 2020-12-12 DIAGNOSIS — I10 Essential (primary) hypertension: Secondary | ICD-10-CM | POA: Diagnosis not present

## 2020-12-12 DIAGNOSIS — B000 Eczema herpeticum: Secondary | ICD-10-CM | POA: Diagnosis not present

## 2020-12-12 DIAGNOSIS — Z6831 Body mass index (BMI) 31.0-31.9, adult: Secondary | ICD-10-CM | POA: Diagnosis not present

## 2020-12-12 DIAGNOSIS — E782 Mixed hyperlipidemia: Secondary | ICD-10-CM | POA: Diagnosis not present

## 2020-12-12 DIAGNOSIS — G894 Chronic pain syndrome: Secondary | ICD-10-CM | POA: Diagnosis not present

## 2020-12-12 DIAGNOSIS — E663 Overweight: Secondary | ICD-10-CM | POA: Diagnosis not present

## 2020-12-21 ENCOUNTER — Encounter: Payer: Self-pay | Admitting: Neurology

## 2020-12-21 DIAGNOSIS — M47816 Spondylosis without myelopathy or radiculopathy, lumbar region: Secondary | ICD-10-CM | POA: Diagnosis not present

## 2020-12-21 DIAGNOSIS — M961 Postlaminectomy syndrome, not elsewhere classified: Secondary | ICD-10-CM | POA: Diagnosis not present

## 2020-12-21 DIAGNOSIS — G894 Chronic pain syndrome: Secondary | ICD-10-CM | POA: Diagnosis not present

## 2020-12-21 DIAGNOSIS — Z79891 Long term (current) use of opiate analgesic: Secondary | ICD-10-CM | POA: Diagnosis not present

## 2021-01-09 DIAGNOSIS — F411 Generalized anxiety disorder: Secondary | ICD-10-CM | POA: Diagnosis not present

## 2021-02-23 DIAGNOSIS — G894 Chronic pain syndrome: Secondary | ICD-10-CM | POA: Diagnosis not present

## 2021-02-23 DIAGNOSIS — M47816 Spondylosis without myelopathy or radiculopathy, lumbar region: Secondary | ICD-10-CM | POA: Diagnosis not present

## 2021-02-23 DIAGNOSIS — M961 Postlaminectomy syndrome, not elsewhere classified: Secondary | ICD-10-CM | POA: Diagnosis not present

## 2021-02-23 DIAGNOSIS — Z79891 Long term (current) use of opiate analgesic: Secondary | ICD-10-CM | POA: Diagnosis not present

## 2021-03-01 NOTE — Progress Notes (Signed)
NEUROLOGY CONSULTATION NOTE  Paul Pope MRN: [id number] DOB: [birth year]  Referring provider: Nicholaus Bloom, MD Primary care provider: Celene Squibb, MD  Reason for consult:  second opinion regarding diagnosis of multiple sclerosis  Assessment/Plan:   It is my opinion that Paul Pope does NOT meet the criteria for multiple sclerosis.  He was found to have white matter changes in the brain, which although abnormal, were nonspecific,without any evidence of abnormal enhancement or involvement of the spinal cord to suggest demyelinating disease.  Such findings may be seen in cerebrovascular disease, migraine or sometimes no definitive etiology.  He underwent an LP for CSF analysis which was reportedly negative.  His reported symptoms have been non-focal/non-lateralizing, namely fatigue and pain, which may be related to depression, sleep apnea or underlying chronic pain syndrome.    To help support or rule out possible MS, he may consider a repeat LP.  This has been suggested by Paul Pope, but Paul Pope has declined.  However, over the course of 10 years, his brain MRI has been essentially unchanged.  Even if he had MS, I would say he has an inactive secondary progressive MS, for which disease modifying therapy would not be indicated anyway.     Subjective:  Paul Pope is a 65 year old right-handed male with psoriasis, HTN, migraines and history of kidney stones who presents for second opinion regarding diagnosis of multiple sclerosis.  History supplemented by neurologist's and referring provider's notes.  3-4 years ago - left trigeminal neuralgia? Going to check left facial to neck Paul Pope - Paul Pope thinks he was better on Gilenya  Left temple nausa - Sunday afternoons  Required periodic steroids - increased tightness GM RA no MS, dad arthritis  Around 2000, he developed migraines, fatigue, muscle spasms in his upper back, shoulders and legs.  He has psoriasis but there has been  uncertainty if he has associated arthritis.  He had an MRI of the brain which reportedly showed white matter changes.  He underwent an LP which reportedly did not reveal oligoclonal bands.  He underwent cerebral angiogram in 2001 which was negative for vasculitis.  He was sent to [place] specialist, Dr. Effie Shy at [facility] for second opinion who diagnosed multiple sclerosis.  He was started on Betaseron and later Rebif, which were stopped due to intolerance.  He was started on Copaxone.  He reports periodic steroids for flares described as worsening "tightness".  He was switched to [place] in 2012 after MRI revealed progression of white matter changes.  He developed left sided head/facial pain, possibly thought to be trigeminal neuralgia and his neurologist.stopped Gilenya in March 2019 with plan to start [place].  When his neurologist moved, he then established care with MS specialist, Dr. Arlice Colt, in June 2019 who questioned diagnosis and considered white matter changes due to cerebrovascular disease.  Repeat MRIs in 2020 were stable.  Continues to have fatigue, generalized weakness and pain/spasms involving the back and extremities.  Worse during warm weather.  He reports longstanding history of depression.  Endorses cognitive deficits.  He is treated for chronic pain by Dr. Hardin Pope who noted that he seemed to have been doing better when he was on Gilenya.   Current DMT:  None Past DMT:  Betaseron, Rebif, Copaxone, Gilenya  Current medications:  baclofen, gabapentin, hydroxyzine, paroxetine Past medications:  Cymbalta (depression), Wellbutrin (for depression, caused headaches), Adderall XR (for focus, worsened headaches)  Imaging: All imaging personally reviewed except MRI from 2001. 10/22/2018 MRI  BRAIN W WO:  - Scattered periventricular and subcortical and juxtacortical foci of non-specific gliosis. - No abnormal lesions are seen on post contrast views.   - No change from MRI on  08/16/17. 10/22/2018 MRI C-SPINE WO:  - At C3-4: disc bulging and uncovertebral joint hypertrophy with severe biforaminal stenosis; slight T2 hyperintensity within right spinal cord on axial views is likely artifactual in nature, and not seen on sagittal STIR views. - No intrinsic or compressive spinal cord lesions. 08/16/2017 MRI BRAIN:  1. Chronic cerebral white matter disease appears stable since 2012. No new or enhancing lesion  2. No new intracranial abnormality. 01/21/2015 MRI C-SPINE WO:  Spinal cord is normal.  Mild multilevel degenerative changes with no nerve root compression. 09/05/2010 MRI BRAIN W WO:  Findings consistent with clinical diagnosis of multiple sclerosis, with widespread subcortical and periventricular white matter signal  abnormality, moderately progressive since 2005.  No acute plaque activity is observed.  No specific abnormalities are seen which might result in leg weakness.  Visualized upper cervical cord or  unremarkable. 02/02/2004 MRI BRAIN W WO:  When compared to 03/03/02 MR scan from [facility], there has been little if any change in the appearance and distribution of the white matter lesions.  No new lesions are detected.  Mild generalized cerebral atrophic changes are again noted and appear stable. 03/03/2002 MRI BRAIN W WO:  Multiple T2/FLAIR hyperintense foci in the hemispheres, predominantly in the subcortical and deep white matter.  There are no classic periventricular foci.  No foci are noted in the infratentorial white matter.  Normal enhancement pattern.  04/13/2000 MRI BRAIN W WO:  There are stable punctate white matter hyperintensities on the FLAIR and T2-weighted sequences (compared to prior imaging from 01/19/99).  No definite brainstem lesions are identified.  Middle cerebellar peduncles appear normal.  No areas of abnormal contrast enhancement.  Labs: 03/05/2018 Serum:  aldolase 5, sed rate 5, RF 5, CRP negative, HLA-B27 negative, CCP IgG/IgA antibodies  5  PAST MEDICAL HISTORY: Past Medical History:  Diagnosis Date   Hypertension    Kidney stones    Migraine    Multiple sclerosis ([place])    Vision abnormalities     PAST SURGICAL HISTORY: Past Surgical History:  Procedure Laterality Date   BACK SURGERY     FOOT SURGERY     I & D EXTREMITY Left 08/04/2016   Procedure: IRRIGATION AND DEBRIDEMENT EXTREMITY LEFT HAND;  Surgeon: Dayna Barker, MD;  Location: [facility];  Service: Plastics;  Laterality: Left;    MEDICATIONS: Current Outpatient Medications on File Prior to Visit  Medication Sig Dispense Refill   acidophilus (RISAQUAD) CAPS capsule Take 1 capsule by mouth daily.     aspirin EC 81 MG tablet Take 81 mg by mouth daily.     baclofen (LIORESAL) 10 MG tablet Take 1/2-1 tablet by mouth three times daily (Patient taking differently: Take 20 mg by mouth. Takes 2-3 times per day) 90 each 3   buPROPion (WELLBUTRIN XL) 300 MG 24 hr tablet Take 1 tablet (300 mg total) by mouth daily. Please call [phone] to schedule appt or may request future refills from PCP. 30 tablet 5   cholecalciferol (VITAMIN D) 1000 units tablet Take 3,000 Units by mouth daily.     desvenlafaxine (PRISTIQ) 100 MG 24 hr tablet TAKE 1 TABLET BY MOUTH ONCE DAILY. 90 tablet 0   etodolac (LODINE) 400 MG tablet TAKE (1) TABLET TWICE DAILY. 60 tablet 1   gabapentin (NEURONTIN) 800 MG tablet  Take 1 tablet (800 mg total) by mouth 4 (four) times daily. 120 tablet 0   lisinopril (PRINIVIL,ZESTRIL) 10 MG tablet Take 1 tablet by mouth daily.     oxyCODONE-acetaminophen (PERCOCET) 10-325 MG tablet Take 4-5 tablets by mouth daily.     polyethylene glycol (MIRALAX / GLYCOLAX) packet Take 17 g by mouth daily as needed (constipation).     No current facility-administered medications on file prior to visit.    ALLERGIES: Allergies  Allergen Reactions   Sulfa Antibiotics    Adderall [Amphetamine-Dextroamphetamine]     Pt reports increase in headaches while on medication     FAMILY HISTORY: Family History  Problem Relation Age of Onset   Atrial fibrillation Mother    Dementia Mother    Heart disease Mother    Lung cancer Father     Objective:  Blood pressure 134/78, pulse 65, height '5\' 11"'  (1.803 m), weight 213 lb 6 oz (96.8 kg), SpO2 97 %. General: No acute distress.  Patient appears well-groomed.   Head:  Normocephalic/atraumatic Eyes:  fundi examined but not visualized Neck: supple, no paraspinal tenderness, full range of motion Back: No paraspinal tenderness Heart: regular rate and rhythm Lungs: Clear to auscultation bilaterally. Vascular: No carotid bruits. Neurological Exam: Mental status: alert and oriented to person, place, and time, recent and remote memory intact, fund of knowledge intact, attention and concentration intact, speech fluent and not dysarthric, language intact. Cranial nerves: CN I: not tested CN II: pupils equal, round and reactive to light, visual fields intact CN III, IV, VI:  full range of motion, no nystagmus, no ptosis CN V: facial sensation intact. CN VII: upper and lower face symmetric CN VIII: hearing intact CN IX, X: gag intact, uvula midline CN XI: sternocleidomastoid and trapezius muscles intact CN XII: tongue midline Bulk & Tone: normal, no fasciculations. Motor:  muscle strength 5/5 throughout Sensation:  Pinprick, temperature and vibratory sensation intact. Deep Tendon Reflexes:  2+ throughout,  toes downgoing.   Finger to nose testing:  Without dysmetria.   Heel to shin:  Without dysmetria.   Gait:  Normal station and stride.  Romberg negative.    Thank you for allowing me to take part in the care of this patient.  Metta Clines, DO  CC:  Celene Squibb, MD  Nicholaus Bloom, MD

## 2021-03-02 ENCOUNTER — Encounter: Payer: Self-pay | Admitting: Neurology

## 2021-03-02 ENCOUNTER — Other Ambulatory Visit: Payer: Self-pay

## 2021-03-02 ENCOUNTER — Ambulatory Visit: Payer: Medicare PPO | Admitting: Neurology

## 2021-03-02 VITALS — BP 134/78 | HR 65 | Ht 71.0 in | Wt 213.4 lb

## 2021-03-02 DIAGNOSIS — R9082 White matter disease, unspecified: Secondary | ICD-10-CM

## 2021-03-02 NOTE — Patient Instructions (Signed)
Based on your clinical history and testing, including repeat MRIs over many years, I cannot say that you have MS.  I wouldn't have started you on a DMT.  To help support a potential diagnosis, you can repeat the spinal tap.  However, even if you do have MS, at this time, I wouldn't necessarily start a DMT anyway as he have not demonstrated any active or progressive disease.  Your symptoms can be explained by other causes but even if these symptoms were related to MS, they are likely chronic symptoms not indicative of active disease.

## 2021-04-10 DIAGNOSIS — G894 Chronic pain syndrome: Secondary | ICD-10-CM | POA: Diagnosis not present

## 2021-04-10 DIAGNOSIS — M961 Postlaminectomy syndrome, not elsewhere classified: Secondary | ICD-10-CM | POA: Diagnosis not present

## 2021-04-10 DIAGNOSIS — M47816 Spondylosis without myelopathy or radiculopathy, lumbar region: Secondary | ICD-10-CM | POA: Diagnosis not present

## 2021-04-10 DIAGNOSIS — Z79891 Long term (current) use of opiate analgesic: Secondary | ICD-10-CM | POA: Diagnosis not present

## 2021-05-29 DIAGNOSIS — M47816 Spondylosis without myelopathy or radiculopathy, lumbar region: Secondary | ICD-10-CM | POA: Diagnosis not present

## 2021-05-29 DIAGNOSIS — M961 Postlaminectomy syndrome, not elsewhere classified: Secondary | ICD-10-CM | POA: Diagnosis not present

## 2021-05-29 DIAGNOSIS — G894 Chronic pain syndrome: Secondary | ICD-10-CM | POA: Diagnosis not present

## 2021-05-29 DIAGNOSIS — Z79891 Long term (current) use of opiate analgesic: Secondary | ICD-10-CM | POA: Diagnosis not present

## 2021-06-07 DIAGNOSIS — F411 Generalized anxiety disorder: Secondary | ICD-10-CM | POA: Diagnosis not present

## 2021-06-07 DIAGNOSIS — G35 Multiple sclerosis: Secondary | ICD-10-CM | POA: Diagnosis not present

## 2021-06-07 DIAGNOSIS — N50811 Right testicular pain: Secondary | ICD-10-CM | POA: Diagnosis not present

## 2021-06-08 DIAGNOSIS — F339 Major depressive disorder, recurrent, unspecified: Secondary | ICD-10-CM | POA: Diagnosis not present

## 2021-06-08 DIAGNOSIS — G8929 Other chronic pain: Secondary | ICD-10-CM | POA: Diagnosis not present

## 2021-06-08 DIAGNOSIS — M549 Dorsalgia, unspecified: Secondary | ICD-10-CM | POA: Diagnosis not present

## 2021-06-08 DIAGNOSIS — G35 Multiple sclerosis: Secondary | ICD-10-CM | POA: Diagnosis not present

## 2021-06-08 DIAGNOSIS — D72819 Decreased white blood cell count, unspecified: Secondary | ICD-10-CM | POA: Diagnosis not present

## 2021-06-08 DIAGNOSIS — F988 Other specified behavioral and emotional disorders with onset usually occurring in childhood and adolescence: Secondary | ICD-10-CM | POA: Diagnosis not present

## 2021-06-13 DIAGNOSIS — N50811 Right testicular pain: Secondary | ICD-10-CM | POA: Diagnosis not present

## 2021-06-21 ENCOUNTER — Other Ambulatory Visit: Payer: Self-pay | Admitting: Psychiatry

## 2021-06-21 DIAGNOSIS — G35 Multiple sclerosis: Secondary | ICD-10-CM

## 2021-07-11 ENCOUNTER — Ambulatory Visit
Admission: RE | Admit: 2021-07-11 | Discharge: 2021-07-11 | Disposition: A | Discharge status: Home / Self Care | Payer: Medicare PPO | Source: Ambulatory Visit | Attending: Psychiatry | Admitting: Psychiatry

## 2021-07-11 ENCOUNTER — Other Ambulatory Visit: Payer: Self-pay

## 2021-07-11 DIAGNOSIS — G35 Multiple sclerosis: Secondary | ICD-10-CM

## 2021-07-11 DIAGNOSIS — R9082 White matter disease, unspecified: Secondary | ICD-10-CM | POA: Diagnosis not present

## 2021-07-11 DIAGNOSIS — G939 Disorder of brain, unspecified: Secondary | ICD-10-CM | POA: Diagnosis not present

## 2021-07-11 MED ORDER — GADOBENATE DIMEGLUMINE 529 MG/ML IV SOLN
18.0000 mL | Freq: Once | INTRAVENOUS | Status: AC | PRN
  Administered 2021-07-11: 18 mL via INTRAVENOUS

## 2021-07-18 DIAGNOSIS — F419 Anxiety disorder, unspecified: Secondary | ICD-10-CM | POA: Diagnosis not present

## 2021-07-18 DIAGNOSIS — F988 Other specified behavioral and emotional disorders with onset usually occurring in childhood and adolescence: Secondary | ICD-10-CM | POA: Diagnosis not present

## 2021-07-18 DIAGNOSIS — G35 Multiple sclerosis: Secondary | ICD-10-CM | POA: Diagnosis not present

## 2021-07-18 DIAGNOSIS — F339 Major depressive disorder, recurrent, unspecified: Secondary | ICD-10-CM | POA: Diagnosis not present

## 2021-07-18 DIAGNOSIS — D72819 Decreased white blood cell count, unspecified: Secondary | ICD-10-CM | POA: Diagnosis not present

## 2021-07-24 DIAGNOSIS — M961 Postlaminectomy syndrome, not elsewhere classified: Secondary | ICD-10-CM | POA: Diagnosis not present

## 2021-07-24 DIAGNOSIS — G894 Chronic pain syndrome: Secondary | ICD-10-CM | POA: Diagnosis not present

## 2021-07-24 DIAGNOSIS — M47816 Spondylosis without myelopathy or radiculopathy, lumbar region: Secondary | ICD-10-CM | POA: Diagnosis not present

## 2021-07-24 DIAGNOSIS — Z79891 Long term (current) use of opiate analgesic: Secondary | ICD-10-CM | POA: Diagnosis not present

## 2021-07-26 DIAGNOSIS — G35 Multiple sclerosis: Secondary | ICD-10-CM | POA: Diagnosis not present

## 2021-07-26 DIAGNOSIS — D72819 Decreased white blood cell count, unspecified: Secondary | ICD-10-CM | POA: Diagnosis not present

## 2021-08-09 ENCOUNTER — Encounter (HOSPITAL_COMMUNITY)
Admission: RE | Admit: 2021-08-09 | Discharge: 2021-08-09 | Disposition: A | Discharge status: Home / Self Care | Payer: Medicare PPO | Source: Ambulatory Visit | Attending: Internal Medicine | Admitting: Internal Medicine

## 2021-09-07 DIAGNOSIS — N50811 Right testicular pain: Secondary | ICD-10-CM | POA: Diagnosis not present

## 2021-09-07 DIAGNOSIS — G35 Multiple sclerosis: Secondary | ICD-10-CM | POA: Diagnosis not present

## 2021-09-12 ENCOUNTER — Encounter (HOSPITAL_COMMUNITY)
Admission: RE | Admit: 2021-09-12 | Discharge: 2021-09-12 | Disposition: A | Discharge status: Home / Self Care | Payer: Medicare PPO | Source: Ambulatory Visit | Attending: Psychiatry | Admitting: Psychiatry

## 2021-09-12 DIAGNOSIS — E559 Vitamin D deficiency, unspecified: Secondary | ICD-10-CM | POA: Diagnosis not present

## 2021-09-12 DIAGNOSIS — I10 Essential (primary) hypertension: Secondary | ICD-10-CM | POA: Diagnosis not present

## 2021-09-14 DIAGNOSIS — L409 Psoriasis, unspecified: Secondary | ICD-10-CM | POA: Diagnosis not present

## 2021-09-14 DIAGNOSIS — F332 Major depressive disorder, recurrent severe without psychotic features: Secondary | ICD-10-CM | POA: Diagnosis not present

## 2021-09-14 DIAGNOSIS — E663 Overweight: Secondary | ICD-10-CM | POA: Diagnosis not present

## 2021-09-14 DIAGNOSIS — I10 Essential (primary) hypertension: Secondary | ICD-10-CM | POA: Diagnosis not present

## 2021-09-14 DIAGNOSIS — B000 Eczema herpeticum: Secondary | ICD-10-CM | POA: Diagnosis not present

## 2021-09-14 DIAGNOSIS — E782 Mixed hyperlipidemia: Secondary | ICD-10-CM | POA: Diagnosis not present

## 2021-09-14 DIAGNOSIS — G894 Chronic pain syndrome: Secondary | ICD-10-CM | POA: Diagnosis not present

## 2021-09-14 DIAGNOSIS — Z0001 Encounter for general adult medical examination with abnormal findings: Secondary | ICD-10-CM | POA: Diagnosis not present

## 2021-09-14 DIAGNOSIS — G35 Multiple sclerosis: Secondary | ICD-10-CM | POA: Diagnosis not present

## 2021-09-18 DIAGNOSIS — G894 Chronic pain syndrome: Secondary | ICD-10-CM | POA: Diagnosis not present

## 2021-09-18 DIAGNOSIS — M47816 Spondylosis without myelopathy or radiculopathy, lumbar region: Secondary | ICD-10-CM | POA: Diagnosis not present

## 2021-09-18 DIAGNOSIS — Z79891 Long term (current) use of opiate analgesic: Secondary | ICD-10-CM | POA: Diagnosis not present

## 2021-09-18 DIAGNOSIS — M961 Postlaminectomy syndrome, not elsewhere classified: Secondary | ICD-10-CM | POA: Diagnosis not present

## 2021-10-11 ENCOUNTER — Other Ambulatory Visit (HOSPITAL_COMMUNITY): Payer: Self-pay | Admitting: Family Medicine

## 2021-10-11 DIAGNOSIS — N50811 Right testicular pain: Secondary | ICD-10-CM

## 2021-10-11 DIAGNOSIS — N390 Urinary tract infection, site not specified: Secondary | ICD-10-CM | POA: Diagnosis not present

## 2021-10-11 DIAGNOSIS — F339 Major depressive disorder, recurrent, unspecified: Secondary | ICD-10-CM | POA: Diagnosis not present

## 2021-10-11 DIAGNOSIS — G35 Multiple sclerosis: Secondary | ICD-10-CM | POA: Diagnosis not present

## 2021-10-11 DIAGNOSIS — G3184 Mild cognitive impairment, so stated: Secondary | ICD-10-CM | POA: Diagnosis not present

## 2021-10-13 ENCOUNTER — Encounter: Payer: Self-pay | Admitting: *Deleted

## 2021-10-18 DIAGNOSIS — G35 Multiple sclerosis: Secondary | ICD-10-CM | POA: Diagnosis not present

## 2021-10-30 ENCOUNTER — Other Ambulatory Visit: Payer: Self-pay

## 2021-10-30 ENCOUNTER — Ambulatory Visit (HOSPITAL_COMMUNITY)
Admission: RE | Admit: 2021-10-30 | Discharge: 2021-10-30 | Disposition: A | Discharge status: Home / Self Care | Payer: Medicare PPO | Source: Ambulatory Visit | Attending: Family Medicine | Admitting: Family Medicine

## 2021-10-30 DIAGNOSIS — N50811 Right testicular pain: Secondary | ICD-10-CM | POA: Insufficient documentation

## 2021-10-30 DIAGNOSIS — N503 Cyst of epididymis: Secondary | ICD-10-CM | POA: Diagnosis not present

## 2021-11-01 ENCOUNTER — Other Ambulatory Visit: Payer: Self-pay

## 2021-11-01 ENCOUNTER — Encounter: Payer: Self-pay | Admitting: Urology

## 2021-11-01 ENCOUNTER — Ambulatory Visit: Payer: Medicare PPO | Admitting: Urology

## 2021-11-01 VITALS — BP 128/75 | HR 65

## 2021-11-01 DIAGNOSIS — N401 Enlarged prostate with lower urinary tract symptoms: Secondary | ICD-10-CM

## 2021-11-01 DIAGNOSIS — R3912 Poor urinary stream: Secondary | ICD-10-CM | POA: Diagnosis not present

## 2021-11-01 DIAGNOSIS — N50819 Testicular pain, unspecified: Secondary | ICD-10-CM | POA: Diagnosis not present

## 2021-11-01 DIAGNOSIS — N138 Other obstructive and reflux uropathy: Secondary | ICD-10-CM | POA: Diagnosis not present

## 2021-11-01 DIAGNOSIS — R1031 Right lower quadrant pain: Secondary | ICD-10-CM

## 2021-11-01 LAB — URINALYSIS, ROUTINE W REFLEX MICROSCOPIC
Bilirubin, UA: NEGATIVE
Glucose, UA: NEGATIVE
Ketones, UA: NEGATIVE
Leukocytes,UA: NEGATIVE
Nitrite, UA: NEGATIVE
Protein,UA: NEGATIVE
RBC, UA: NEGATIVE
Specific Gravity, UA: 1.02 (ref 1.005–1.030)
Urobilinogen, Ur: 0.2 mg/dL (ref 0.2–1.0)
pH, UA: 6 (ref 5.0–7.5)

## 2021-11-01 LAB — BLADDER SCAN AMB NON-IMAGING: Scan Result: 20

## 2021-11-01 MED ORDER — ALFUZOSIN HCL ER 10 MG PO TB24: 10.0000 mg | ORAL_TABLET | Freq: Every day | ORAL | 11 refills | Status: AC

## 2021-11-01 NOTE — Patient Instructions (Signed)

## 2021-11-01 NOTE — Progress Notes (Signed)
? ?11/01/2021 ?12:12 PM  ? ?Paul Pope ?1955/09/21 ?[id number] ? ?Referring provider: Leone Payor, FNP ?[address] ?[address] ?[place],  [place] [address] ? ?Right testicular pain ? ? ?HPI: ?Paul Pope is a 65yo here for evaluation of right testicular pain. In October 2022 he developed right testicular pain that starts in his inguinal region and radiates to his right testis. He notes sitting and lifting makes the pain worse. When he drives the pain is worse.  He has a hx of nephrolithiasis. IPSS 23 QOL3. Urine stream is weak. Nocturia 3x. He previously tried flomax which failed to improve his LUTS.  ?He has a hx of multiple sclerosis.  ? ? ?PMH: ?Past Medical History:  ?Diagnosis Date  ? Hypertension   ? Kidney stones   ? Migraine   ? Multiple sclerosis ([facility])   ? Vision abnormalities   ? ? ?Surgical History: ?Past Surgical History:  ?Procedure Laterality Date  ? BACK SURGERY    ? FOOT SURGERY    ? I & D EXTREMITY Left 08/04/2016  ? Procedure: IRRIGATION AND DEBRIDEMENT EXTREMITY LEFT HAND;  Surgeon: Knute Neu, MD;  Location: [facility];  Service: Plastics;  Laterality: Left;  ? ? ?Home Medications:  ?Allergies as of 11/01/2021   ? ?   Reactions  ? Sulfa Antibiotics   ? Adderall [amphetamine-dextroamphetamine]   ? Pt reports increase in headaches while on medication  ? ?  ? ?  ?Medication List  ?  ? ?  ? Accurate as of November 01, 2021 12:12 PM. If you have any questions, ask your nurse or doctor.  ?  ?  ? ?  ? ?acidophilus Caps capsule ?Take 1 capsule by mouth daily. ?  ?aspirin EC 81 MG tablet ?Take 81 mg by mouth daily. ?  ?baclofen 10 MG tablet ?Commonly known as: LIORESAL ?Take 1/2-1 tablet by mouth three times daily ?What changed:  ?how much to take ?how to take this ?additional instructions ?  ?baclofen 20 MG tablet ?Commonly known as: LIORESAL ?Take by mouth. ?What changed: Another medication with the same name was changed. Make sure you understand how and when to take each. ?  ?busPIRone 10 MG tablet ?Commonly known  as: BUSPAR ?Take by mouth. ?  ?cholecalciferol 1000 units tablet ?Commonly known as: VITAMIN D ?Take 3,000 Units by mouth daily. ?  ?donepezil 5 MG tablet ?Commonly known as: ARICEPT ?Take by mouth. ?  ?etodolac 400 MG tablet ?Commonly known as: LODINE ?TAKE (1) TABLET TWICE DAILY. ?  ?gabapentin 800 MG tablet ?Commonly known as: Neurontin ?Take 1 tablet (800 mg total) by mouth 4 (four) times daily. ?  ?hydrOXYzine 25 MG tablet ?Commonly known as: ATARAX ?Take 25 mg by mouth 3 (three) times daily as needed. ?  ?lisinopril 10 MG tablet ?Commonly known as: ZESTRIL ?Take 1 tablet by mouth daily. ?  ?oxyCODONE-acetaminophen 10-325 MG tablet ?Commonly known as: PERCOCET ?Take 4-5 tablets by mouth daily. ?  ?PARoxetine 25 MG 24 hr tablet ?Commonly known as: PAXIL-CR ?Take 25 mg by mouth daily. ?  ?polyethylene glycol 17 g packet ?Commonly known as: MIRALAX / GLYCOLAX ?Take 17 g by mouth daily as needed (constipation). ?  ? ?  ? ? ?Allergies:  ?Allergies  ?Allergen Reactions  ? Sulfa Antibiotics   ? Adderall [Amphetamine-Dextroamphetamine]   ?  Pt reports increase in headaches while on medication  ? ? ?Family History: ?Family History  ?Problem Relation Age of Onset  ? Atrial fibrillation Mother   ?  Dementia Mother   ? Heart disease Mother   ? Lung cancer Father   ? ? ?Social History:  reports that he has never smoked. He has never used smokeless tobacco. He reports that he does not drink alcohol and does not use drugs. ? ?ROS: ?All other review of systems were reviewed and are negative except what is noted above in HPI ? ?Physical Exam: ?BP 128/75   Pulse 65   ?Constitutional:  Alert and oriented, No acute distress. ?HEENT: [place] AT, moist mucus membranes.  Trachea midline, no masses. ?Cardiovascular: No clubbing, cyanosis, or edema. ?Respiratory: Normal respiratory effort, no increased work of breathing. ?GI: Abdomen is soft, nontender, nondistended, no abdominal masses ?GU: No CVA tenderness. Circumcised phallus. No  masses/lesions on penis, testis, scrotum. Prostate 30g smooth no nodules no induration.  ?Lymph: No cervical or inguinal lymphadenopathy. ?Skin: No rashes, bruises or suspicious lesions. ?Neurologic: Grossly intact, no focal deficits, moving all 4 extremities. ?Psychiatric: Normal mood and affect. ? ?Laboratory Data: ?Lab Results  ?Component Value Date  ? WBC 6.1 09/08/2018  ? HGB 15.7 09/08/2018  ? HCT 44.0 09/08/2018  ? MCV 88 09/08/2018  ? PLT 236 09/08/2018  ? ? ?Lab Results  ?Component Value Date  ? CREATININE 0.80 08/31/2014  ? ? ?No results found for: PSA ? ?No results found for: TESTOSTERONE ? ?No results found for: HGBA1C ? ?Urinalysis ?No results found for: COLORURINE, APPEARANCEUR, LABSPEC, PHURINE, GLUCOSEU, HGBUR, BILIRUBINUR, KETONESUR, PROTEINUR, UROBILINOGEN, NITRITE, LEUKOCYTESUR ? ?No results found for: LABMICR, WBCUA, RBCUA, LABEPIT, MUCUS, BACTERIA ? ?Pertinent Imaging: ?Scrotal US 10/30/2021: Images reviewed and discussed with the patient ?No results found for this or any previous visit. ? ?No results found for this or any previous visit. ? ?No results found for this or any previous visit. ? ?No results found for this or any previous visit. ? ?No results found for this or any previous visit. ? ?No results found for this or any previous visit. ? ?No results found for this or any previous visit. ? ?No results found for this or any previous visit. ? ? ?Assessment & Plan:   ? ?1. Pain in testicle, unspecified laterality ?-CT stone study ?- Urinalysis, Routine w reflex microscopic ?- BLADDER SCAN AMB NON-IMAGING ? ?2. BPH with weak urinary stream ?-We will trial uroxatral 10mg  qhs. If this fails to improve his LUTS we will proceed with cystoscopy ? ?No follow-ups on file. ? ? , MD ? ?[facility] ?  ?

## 2021-11-01 NOTE — Progress Notes (Signed)
post void residual= 20 

## 2021-11-13 DIAGNOSIS — M47816 Spondylosis without myelopathy or radiculopathy, lumbar region: Secondary | ICD-10-CM | POA: Diagnosis not present

## 2021-11-13 DIAGNOSIS — M961 Postlaminectomy syndrome, not elsewhere classified: Secondary | ICD-10-CM | POA: Diagnosis not present

## 2021-11-13 DIAGNOSIS — Z79891 Long term (current) use of opiate analgesic: Secondary | ICD-10-CM | POA: Diagnosis not present

## 2021-11-13 DIAGNOSIS — G894 Chronic pain syndrome: Secondary | ICD-10-CM | POA: Diagnosis not present

## 2021-12-12 ENCOUNTER — Ambulatory Visit (HOSPITAL_COMMUNITY)
Admission: RE | Admit: 2021-12-12 | Discharge: 2021-12-12 | Disposition: A | Discharge status: Home / Self Care | Payer: Medicare PPO | Source: Ambulatory Visit | Attending: Urology | Admitting: Urology

## 2021-12-12 DIAGNOSIS — K769 Liver disease, unspecified: Secondary | ICD-10-CM | POA: Diagnosis not present

## 2021-12-12 DIAGNOSIS — N3289 Other specified disorders of bladder: Secondary | ICD-10-CM | POA: Diagnosis not present

## 2021-12-12 DIAGNOSIS — R1031 Right lower quadrant pain: Secondary | ICD-10-CM | POA: Insufficient documentation

## 2021-12-12 DIAGNOSIS — I700 Atherosclerosis of aorta: Secondary | ICD-10-CM | POA: Diagnosis not present

## 2021-12-12 DIAGNOSIS — N200 Calculus of kidney: Secondary | ICD-10-CM | POA: Diagnosis not present

## 2021-12-13 ENCOUNTER — Other Ambulatory Visit: Payer: Medicare PPO | Admitting: Urology

## 2022-01-03 DIAGNOSIS — H9193 Unspecified hearing loss, bilateral: Secondary | ICD-10-CM | POA: Diagnosis not present

## 2022-01-08 DIAGNOSIS — M961 Postlaminectomy syndrome, not elsewhere classified: Secondary | ICD-10-CM | POA: Diagnosis not present

## 2022-01-08 DIAGNOSIS — G894 Chronic pain syndrome: Secondary | ICD-10-CM | POA: Diagnosis not present

## 2022-01-08 DIAGNOSIS — Z79891 Long term (current) use of opiate analgesic: Secondary | ICD-10-CM | POA: Diagnosis not present

## 2022-01-08 DIAGNOSIS — M47816 Spondylosis without myelopathy or radiculopathy, lumbar region: Secondary | ICD-10-CM | POA: Diagnosis not present

## 2022-02-21 DIAGNOSIS — F339 Major depressive disorder, recurrent, unspecified: Secondary | ICD-10-CM | POA: Diagnosis not present

## 2022-02-21 DIAGNOSIS — D72819 Decreased white blood cell count, unspecified: Secondary | ICD-10-CM | POA: Diagnosis not present

## 2022-02-21 DIAGNOSIS — F988 Other specified behavioral and emotional disorders with onset usually occurring in childhood and adolescence: Secondary | ICD-10-CM | POA: Diagnosis not present

## 2022-02-21 DIAGNOSIS — G35 Multiple sclerosis: Secondary | ICD-10-CM | POA: Diagnosis not present

## 2022-02-21 DIAGNOSIS — R4189 Other symptoms and signs involving cognitive functions and awareness: Secondary | ICD-10-CM | POA: Diagnosis not present

## 2022-02-21 DIAGNOSIS — F419 Anxiety disorder, unspecified: Secondary | ICD-10-CM | POA: Diagnosis not present

## 2022-02-21 DIAGNOSIS — L409 Psoriasis, unspecified: Secondary | ICD-10-CM | POA: Diagnosis not present

## 2022-03-05 DIAGNOSIS — G894 Chronic pain syndrome: Secondary | ICD-10-CM | POA: Diagnosis not present

## 2022-03-05 DIAGNOSIS — M47816 Spondylosis without myelopathy or radiculopathy, lumbar region: Secondary | ICD-10-CM | POA: Diagnosis not present

## 2022-03-05 DIAGNOSIS — M961 Postlaminectomy syndrome, not elsewhere classified: Secondary | ICD-10-CM | POA: Diagnosis not present

## 2022-03-05 DIAGNOSIS — Z79891 Long term (current) use of opiate analgesic: Secondary | ICD-10-CM | POA: Diagnosis not present

## 2022-03-07 DIAGNOSIS — E782 Mixed hyperlipidemia: Secondary | ICD-10-CM | POA: Diagnosis not present

## 2022-03-07 DIAGNOSIS — E559 Vitamin D deficiency, unspecified: Secondary | ICD-10-CM | POA: Diagnosis not present

## 2022-03-15 DIAGNOSIS — Z0001 Encounter for general adult medical examination with abnormal findings: Secondary | ICD-10-CM | POA: Diagnosis not present

## 2022-03-15 DIAGNOSIS — G3184 Mild cognitive impairment, so stated: Secondary | ICD-10-CM | POA: Diagnosis not present

## 2022-03-15 DIAGNOSIS — E782 Mixed hyperlipidemia: Secondary | ICD-10-CM | POA: Diagnosis not present

## 2022-03-15 DIAGNOSIS — F339 Major depressive disorder, recurrent, unspecified: Secondary | ICD-10-CM | POA: Diagnosis not present

## 2022-03-15 DIAGNOSIS — G35 Multiple sclerosis: Secondary | ICD-10-CM | POA: Diagnosis not present

## 2022-03-15 DIAGNOSIS — L409 Psoriasis, unspecified: Secondary | ICD-10-CM | POA: Diagnosis not present

## 2022-03-15 DIAGNOSIS — I10 Essential (primary) hypertension: Secondary | ICD-10-CM | POA: Diagnosis not present

## 2022-03-15 DIAGNOSIS — N50811 Right testicular pain: Secondary | ICD-10-CM | POA: Diagnosis not present

## 2022-03-15 DIAGNOSIS — F332 Major depressive disorder, recurrent severe without psychotic features: Secondary | ICD-10-CM | POA: Diagnosis not present

## 2022-04-27 DIAGNOSIS — H9113 Presbycusis, bilateral: Secondary | ICD-10-CM | POA: Diagnosis not present

## 2022-05-01 DIAGNOSIS — H903 Sensorineural hearing loss, bilateral: Secondary | ICD-10-CM | POA: Diagnosis not present

## 2022-05-14 DIAGNOSIS — G894 Chronic pain syndrome: Secondary | ICD-10-CM | POA: Diagnosis not present

## 2022-05-14 DIAGNOSIS — M961 Postlaminectomy syndrome, not elsewhere classified: Secondary | ICD-10-CM | POA: Diagnosis not present

## 2022-05-14 DIAGNOSIS — Z79891 Long term (current) use of opiate analgesic: Secondary | ICD-10-CM | POA: Diagnosis not present

## 2022-05-14 DIAGNOSIS — M47816 Spondylosis without myelopathy or radiculopathy, lumbar region: Secondary | ICD-10-CM | POA: Diagnosis not present

## 2022-05-22 DIAGNOSIS — R4189 Other symptoms and signs involving cognitive functions and awareness: Secondary | ICD-10-CM | POA: Diagnosis not present

## 2022-05-22 DIAGNOSIS — F5101 Primary insomnia: Secondary | ICD-10-CM | POA: Diagnosis not present

## 2022-05-22 DIAGNOSIS — F988 Other specified behavioral and emotional disorders with onset usually occurring in childhood and adolescence: Secondary | ICD-10-CM | POA: Diagnosis not present

## 2022-05-22 DIAGNOSIS — G35 Multiple sclerosis: Secondary | ICD-10-CM | POA: Diagnosis not present

## 2022-07-09 DIAGNOSIS — M961 Postlaminectomy syndrome, not elsewhere classified: Secondary | ICD-10-CM | POA: Diagnosis not present

## 2022-07-09 DIAGNOSIS — Z79891 Long term (current) use of opiate analgesic: Secondary | ICD-10-CM | POA: Diagnosis not present

## 2022-07-09 DIAGNOSIS — G894 Chronic pain syndrome: Secondary | ICD-10-CM | POA: Diagnosis not present

## 2022-07-09 DIAGNOSIS — M47816 Spondylosis without myelopathy or radiculopathy, lumbar region: Secondary | ICD-10-CM | POA: Diagnosis not present

## 2022-08-22 DIAGNOSIS — M549 Dorsalgia, unspecified: Secondary | ICD-10-CM | POA: Diagnosis not present

## 2022-08-22 DIAGNOSIS — L409 Psoriasis, unspecified: Secondary | ICD-10-CM | POA: Diagnosis not present

## 2022-08-22 DIAGNOSIS — D72819 Decreased white blood cell count, unspecified: Secondary | ICD-10-CM | POA: Diagnosis not present

## 2022-08-22 DIAGNOSIS — F988 Other specified behavioral and emotional disorders with onset usually occurring in childhood and adolescence: Secondary | ICD-10-CM | POA: Diagnosis not present

## 2022-08-22 DIAGNOSIS — G35 Multiple sclerosis: Secondary | ICD-10-CM | POA: Diagnosis not present

## 2022-08-22 DIAGNOSIS — F419 Anxiety disorder, unspecified: Secondary | ICD-10-CM | POA: Diagnosis not present

## 2022-08-22 DIAGNOSIS — F5101 Primary insomnia: Secondary | ICD-10-CM | POA: Diagnosis not present

## 2022-08-22 DIAGNOSIS — F339 Major depressive disorder, recurrent, unspecified: Secondary | ICD-10-CM | POA: Diagnosis not present

## 2022-08-22 DIAGNOSIS — R4189 Other symptoms and signs involving cognitive functions and awareness: Secondary | ICD-10-CM | POA: Diagnosis not present

## 2022-08-27 DIAGNOSIS — G35 Multiple sclerosis: Secondary | ICD-10-CM | POA: Diagnosis not present

## 2022-08-27 DIAGNOSIS — F419 Anxiety disorder, unspecified: Secondary | ICD-10-CM | POA: Diagnosis not present

## 2022-08-27 DIAGNOSIS — D513 Other dietary vitamin B12 deficiency anemia: Secondary | ICD-10-CM | POA: Diagnosis not present

## 2022-08-27 DIAGNOSIS — R4189 Other symptoms and signs involving cognitive functions and awareness: Secondary | ICD-10-CM | POA: Diagnosis not present

## 2022-08-31 DIAGNOSIS — M961 Postlaminectomy syndrome, not elsewhere classified: Secondary | ICD-10-CM | POA: Diagnosis not present

## 2022-08-31 DIAGNOSIS — G894 Chronic pain syndrome: Secondary | ICD-10-CM | POA: Diagnosis not present

## 2022-08-31 DIAGNOSIS — M47816 Spondylosis without myelopathy or radiculopathy, lumbar region: Secondary | ICD-10-CM | POA: Diagnosis not present

## 2022-08-31 DIAGNOSIS — Z79891 Long term (current) use of opiate analgesic: Secondary | ICD-10-CM | POA: Diagnosis not present

## 2022-09-14 DIAGNOSIS — E782 Mixed hyperlipidemia: Secondary | ICD-10-CM | POA: Diagnosis not present

## 2022-09-14 DIAGNOSIS — I10 Essential (primary) hypertension: Secondary | ICD-10-CM | POA: Diagnosis not present

## 2022-09-14 DIAGNOSIS — Z125 Encounter for screening for malignant neoplasm of prostate: Secondary | ICD-10-CM | POA: Diagnosis not present

## 2022-09-20 DIAGNOSIS — F339 Major depressive disorder, recurrent, unspecified: Secondary | ICD-10-CM | POA: Diagnosis not present

## 2022-09-20 DIAGNOSIS — G3184 Mild cognitive impairment, so stated: Secondary | ICD-10-CM | POA: Diagnosis not present

## 2022-09-20 DIAGNOSIS — I10 Essential (primary) hypertension: Secondary | ICD-10-CM | POA: Diagnosis not present

## 2022-09-20 DIAGNOSIS — E782 Mixed hyperlipidemia: Secondary | ICD-10-CM | POA: Diagnosis not present

## 2022-09-20 DIAGNOSIS — N50812 Left testicular pain: Secondary | ICD-10-CM | POA: Diagnosis not present

## 2022-09-20 DIAGNOSIS — L409 Psoriasis, unspecified: Secondary | ICD-10-CM | POA: Diagnosis not present

## 2022-09-20 DIAGNOSIS — G35 Multiple sclerosis: Secondary | ICD-10-CM | POA: Diagnosis not present

## 2022-09-20 DIAGNOSIS — N50811 Right testicular pain: Secondary | ICD-10-CM | POA: Diagnosis not present

## 2022-09-20 DIAGNOSIS — Z0001 Encounter for general adult medical examination with abnormal findings: Secondary | ICD-10-CM | POA: Diagnosis not present

## 2022-10-26 DIAGNOSIS — M47816 Spondylosis without myelopathy or radiculopathy, lumbar region: Secondary | ICD-10-CM | POA: Diagnosis not present

## 2022-10-26 DIAGNOSIS — G894 Chronic pain syndrome: Secondary | ICD-10-CM | POA: Diagnosis not present

## 2022-10-26 DIAGNOSIS — M961 Postlaminectomy syndrome, not elsewhere classified: Secondary | ICD-10-CM | POA: Diagnosis not present

## 2022-10-26 DIAGNOSIS — Z79891 Long term (current) use of opiate analgesic: Secondary | ICD-10-CM | POA: Diagnosis not present

## 2022-11-21 DIAGNOSIS — R4189 Other symptoms and signs involving cognitive functions and awareness: Secondary | ICD-10-CM | POA: Diagnosis not present

## 2022-11-21 DIAGNOSIS — G35 Multiple sclerosis: Secondary | ICD-10-CM | POA: Diagnosis not present

## 2022-11-21 DIAGNOSIS — M542 Cervicalgia: Secondary | ICD-10-CM | POA: Diagnosis not present

## 2022-11-28 ENCOUNTER — Other Ambulatory Visit: Payer: Self-pay | Admitting: Nurse Practitioner

## 2022-11-28 DIAGNOSIS — M542 Cervicalgia: Secondary | ICD-10-CM

## 2022-11-28 DIAGNOSIS — R4189 Other symptoms and signs involving cognitive functions and awareness: Secondary | ICD-10-CM

## 2022-12-22 ENCOUNTER — Other Ambulatory Visit: Payer: Medicare PPO

## 2022-12-24 DIAGNOSIS — Z79891 Long term (current) use of opiate analgesic: Secondary | ICD-10-CM | POA: Diagnosis not present

## 2022-12-24 DIAGNOSIS — G894 Chronic pain syndrome: Secondary | ICD-10-CM | POA: Diagnosis not present

## 2022-12-24 DIAGNOSIS — M961 Postlaminectomy syndrome, not elsewhere classified: Secondary | ICD-10-CM | POA: Diagnosis not present

## 2022-12-24 DIAGNOSIS — M47816 Spondylosis without myelopathy or radiculopathy, lumbar region: Secondary | ICD-10-CM | POA: Diagnosis not present

## 2023-01-06 ENCOUNTER — Ambulatory Visit
Admission: RE | Admit: 2023-01-06 | Discharge: 2023-01-06 | Disposition: A | Discharge status: Home / Self Care | Payer: Medicare PPO | Source: Ambulatory Visit | Attending: Nurse Practitioner | Admitting: Nurse Practitioner

## 2023-01-06 DIAGNOSIS — M542 Cervicalgia: Secondary | ICD-10-CM

## 2023-01-06 DIAGNOSIS — R4189 Other symptoms and signs involving cognitive functions and awareness: Secondary | ICD-10-CM

## 2023-01-06 DIAGNOSIS — M47812 Spondylosis without myelopathy or radiculopathy, cervical region: Secondary | ICD-10-CM | POA: Diagnosis not present

## 2023-01-06 DIAGNOSIS — G35 Multiple sclerosis: Secondary | ICD-10-CM | POA: Diagnosis not present

## 2023-01-21 LAB — PULMONARY FUNCTION TEST

## 2023-02-18 DIAGNOSIS — G894 Chronic pain syndrome: Secondary | ICD-10-CM | POA: Diagnosis not present

## 2023-02-18 DIAGNOSIS — M47816 Spondylosis without myelopathy or radiculopathy, lumbar region: Secondary | ICD-10-CM | POA: Diagnosis not present

## 2023-02-18 DIAGNOSIS — Z79891 Long term (current) use of opiate analgesic: Secondary | ICD-10-CM | POA: Diagnosis not present

## 2023-02-18 DIAGNOSIS — M961 Postlaminectomy syndrome, not elsewhere classified: Secondary | ICD-10-CM | POA: Diagnosis not present

## 2023-02-27 DIAGNOSIS — R4189 Other symptoms and signs involving cognitive functions and awareness: Secondary | ICD-10-CM | POA: Diagnosis not present

## 2023-02-27 DIAGNOSIS — G35 Multiple sclerosis: Secondary | ICD-10-CM | POA: Diagnosis not present

## 2023-02-27 DIAGNOSIS — F339 Major depressive disorder, recurrent, unspecified: Secondary | ICD-10-CM | POA: Diagnosis not present

## 2023-02-27 DIAGNOSIS — M542 Cervicalgia: Secondary | ICD-10-CM | POA: Diagnosis not present

## 2023-02-27 DIAGNOSIS — F988 Other specified behavioral and emotional disorders with onset usually occurring in childhood and adolescence: Secondary | ICD-10-CM | POA: Diagnosis not present

## 2023-04-15 DIAGNOSIS — M961 Postlaminectomy syndrome, not elsewhere classified: Secondary | ICD-10-CM | POA: Diagnosis not present

## 2023-04-15 DIAGNOSIS — Z79891 Long term (current) use of opiate analgesic: Secondary | ICD-10-CM | POA: Diagnosis not present

## 2023-04-15 DIAGNOSIS — M47816 Spondylosis without myelopathy or radiculopathy, lumbar region: Secondary | ICD-10-CM | POA: Diagnosis not present

## 2023-04-15 DIAGNOSIS — G894 Chronic pain syndrome: Secondary | ICD-10-CM | POA: Diagnosis not present

## 2023-05-23 DIAGNOSIS — I10 Essential (primary) hypertension: Secondary | ICD-10-CM | POA: Diagnosis not present

## 2023-05-23 DIAGNOSIS — E782 Mixed hyperlipidemia: Secondary | ICD-10-CM | POA: Diagnosis not present

## 2023-05-23 DIAGNOSIS — Z125 Encounter for screening for malignant neoplasm of prostate: Secondary | ICD-10-CM | POA: Diagnosis not present

## 2023-06-11 DIAGNOSIS — M47816 Spondylosis without myelopathy or radiculopathy, lumbar region: Secondary | ICD-10-CM | POA: Diagnosis not present

## 2023-06-11 DIAGNOSIS — Z79891 Long term (current) use of opiate analgesic: Secondary | ICD-10-CM | POA: Diagnosis not present

## 2023-06-11 DIAGNOSIS — G894 Chronic pain syndrome: Secondary | ICD-10-CM | POA: Diagnosis not present

## 2023-06-11 DIAGNOSIS — M961 Postlaminectomy syndrome, not elsewhere classified: Secondary | ICD-10-CM | POA: Diagnosis not present

## 2023-06-27 DIAGNOSIS — Z6825 Body mass index (BMI) 25.0-25.9, adult: Secondary | ICD-10-CM | POA: Diagnosis not present

## 2023-06-27 DIAGNOSIS — M542 Cervicalgia: Secondary | ICD-10-CM | POA: Diagnosis not present

## 2023-07-10 DIAGNOSIS — G35 Multiple sclerosis: Secondary | ICD-10-CM | POA: Diagnosis not present

## 2023-07-10 DIAGNOSIS — F988 Other specified behavioral and emotional disorders with onset usually occurring in childhood and adolescence: Secondary | ICD-10-CM | POA: Diagnosis not present

## 2023-08-06 DIAGNOSIS — M47816 Spondylosis without myelopathy or radiculopathy, lumbar region: Secondary | ICD-10-CM | POA: Diagnosis not present

## 2023-08-06 DIAGNOSIS — Z79891 Long term (current) use of opiate analgesic: Secondary | ICD-10-CM | POA: Diagnosis not present

## 2023-08-06 DIAGNOSIS — M961 Postlaminectomy syndrome, not elsewhere classified: Secondary | ICD-10-CM | POA: Diagnosis not present

## 2023-08-06 DIAGNOSIS — G894 Chronic pain syndrome: Secondary | ICD-10-CM | POA: Diagnosis not present

## 2023-10-01 DIAGNOSIS — Z79891 Long term (current) use of opiate analgesic: Secondary | ICD-10-CM | POA: Diagnosis not present

## 2023-10-01 DIAGNOSIS — M47816 Spondylosis without myelopathy or radiculopathy, lumbar region: Secondary | ICD-10-CM | POA: Diagnosis not present

## 2023-10-01 DIAGNOSIS — M961 Postlaminectomy syndrome, not elsewhere classified: Secondary | ICD-10-CM | POA: Diagnosis not present

## 2023-10-01 DIAGNOSIS — G894 Chronic pain syndrome: Secondary | ICD-10-CM | POA: Diagnosis not present

## 2023-11-04 DIAGNOSIS — M542 Cervicalgia: Secondary | ICD-10-CM | POA: Diagnosis not present

## 2023-11-04 DIAGNOSIS — M5412 Radiculopathy, cervical region: Secondary | ICD-10-CM | POA: Diagnosis not present

## 2023-11-26 DIAGNOSIS — Z79891 Long term (current) use of opiate analgesic: Secondary | ICD-10-CM | POA: Diagnosis not present

## 2023-11-26 DIAGNOSIS — M961 Postlaminectomy syndrome, not elsewhere classified: Secondary | ICD-10-CM | POA: Diagnosis not present

## 2023-11-26 DIAGNOSIS — M47816 Spondylosis without myelopathy or radiculopathy, lumbar region: Secondary | ICD-10-CM | POA: Diagnosis not present

## 2023-11-26 DIAGNOSIS — G894 Chronic pain syndrome: Secondary | ICD-10-CM | POA: Diagnosis not present

## 2023-11-29 DIAGNOSIS — E782 Mixed hyperlipidemia: Secondary | ICD-10-CM | POA: Diagnosis not present

## 2023-12-04 ENCOUNTER — Other Ambulatory Visit (HOSPITAL_COMMUNITY): Payer: Self-pay | Admitting: Family Medicine

## 2023-12-04 DIAGNOSIS — G894 Chronic pain syndrome: Secondary | ICD-10-CM | POA: Diagnosis not present

## 2023-12-04 DIAGNOSIS — G3184 Mild cognitive impairment, so stated: Secondary | ICD-10-CM | POA: Diagnosis not present

## 2023-12-04 DIAGNOSIS — E663 Overweight: Secondary | ICD-10-CM | POA: Diagnosis not present

## 2023-12-04 DIAGNOSIS — M79604 Pain in right leg: Secondary | ICD-10-CM

## 2023-12-04 DIAGNOSIS — G35 Multiple sclerosis: Secondary | ICD-10-CM | POA: Diagnosis not present

## 2023-12-04 DIAGNOSIS — E782 Mixed hyperlipidemia: Secondary | ICD-10-CM | POA: Diagnosis not present

## 2023-12-04 DIAGNOSIS — L409 Psoriasis, unspecified: Secondary | ICD-10-CM | POA: Diagnosis not present

## 2023-12-04 DIAGNOSIS — Z0001 Encounter for general adult medical examination with abnormal findings: Secondary | ICD-10-CM | POA: Diagnosis not present

## 2023-12-04 DIAGNOSIS — I10 Essential (primary) hypertension: Secondary | ICD-10-CM | POA: Diagnosis not present

## 2023-12-04 DIAGNOSIS — F339 Major depressive disorder, recurrent, unspecified: Secondary | ICD-10-CM | POA: Diagnosis not present

## 2023-12-10 DIAGNOSIS — M545 Low back pain, unspecified: Secondary | ICD-10-CM | POA: Diagnosis not present

## 2023-12-10 DIAGNOSIS — M170 Bilateral primary osteoarthritis of knee: Secondary | ICD-10-CM | POA: Diagnosis not present

## 2023-12-10 DIAGNOSIS — M25552 Pain in left hip: Secondary | ICD-10-CM | POA: Diagnosis not present

## 2023-12-10 DIAGNOSIS — M25551 Pain in right hip: Secondary | ICD-10-CM | POA: Diagnosis not present

## 2023-12-16 ENCOUNTER — Ambulatory Visit (HOSPITAL_COMMUNITY)
Admission: RE | Admit: 2023-12-16 | Discharge: 2023-12-16 | Disposition: A | Discharge status: Home / Self Care | Source: Ambulatory Visit | Attending: Family Medicine | Admitting: Family Medicine

## 2023-12-16 DIAGNOSIS — E785 Hyperlipidemia, unspecified: Secondary | ICD-10-CM | POA: Diagnosis not present

## 2023-12-16 DIAGNOSIS — M79604 Pain in right leg: Secondary | ICD-10-CM | POA: Diagnosis not present

## 2023-12-16 DIAGNOSIS — M79605 Pain in left leg: Secondary | ICD-10-CM | POA: Diagnosis not present

## 2023-12-16 DIAGNOSIS — I10 Essential (primary) hypertension: Secondary | ICD-10-CM | POA: Diagnosis not present

## 2024-01-01 DIAGNOSIS — M170 Bilateral primary osteoarthritis of knee: Secondary | ICD-10-CM | POA: Diagnosis not present

## 2024-01-02 DIAGNOSIS — Z1211 Encounter for screening for malignant neoplasm of colon: Secondary | ICD-10-CM | POA: Diagnosis not present

## 2024-01-09 LAB — COLOGUARD: COLOGUARD: NEGATIVE

## 2024-01-15 DIAGNOSIS — R413 Other amnesia: Secondary | ICD-10-CM | POA: Diagnosis not present

## 2024-01-15 DIAGNOSIS — F988 Other specified behavioral and emotional disorders with onset usually occurring in childhood and adolescence: Secondary | ICD-10-CM | POA: Diagnosis not present

## 2024-01-15 DIAGNOSIS — F5101 Primary insomnia: Secondary | ICD-10-CM | POA: Diagnosis not present

## 2024-01-15 DIAGNOSIS — F339 Major depressive disorder, recurrent, unspecified: Secondary | ICD-10-CM | POA: Diagnosis not present

## 2024-01-15 DIAGNOSIS — R4189 Other symptoms and signs involving cognitive functions and awareness: Secondary | ICD-10-CM | POA: Diagnosis not present

## 2024-01-15 DIAGNOSIS — G35 Multiple sclerosis: Secondary | ICD-10-CM | POA: Diagnosis not present

## 2024-01-22 DIAGNOSIS — M961 Postlaminectomy syndrome, not elsewhere classified: Secondary | ICD-10-CM | POA: Diagnosis not present

## 2024-01-22 DIAGNOSIS — G894 Chronic pain syndrome: Secondary | ICD-10-CM | POA: Diagnosis not present

## 2024-01-22 DIAGNOSIS — Z79891 Long term (current) use of opiate analgesic: Secondary | ICD-10-CM | POA: Diagnosis not present

## 2024-01-22 DIAGNOSIS — M47816 Spondylosis without myelopathy or radiculopathy, lumbar region: Secondary | ICD-10-CM | POA: Diagnosis not present

## 2024-02-05 DIAGNOSIS — M5412 Radiculopathy, cervical region: Secondary | ICD-10-CM | POA: Diagnosis not present

## 2024-03-31 DIAGNOSIS — Z79891 Long term (current) use of opiate analgesic: Secondary | ICD-10-CM | POA: Diagnosis not present

## 2024-03-31 DIAGNOSIS — M47816 Spondylosis without myelopathy or radiculopathy, lumbar region: Secondary | ICD-10-CM | POA: Diagnosis not present

## 2024-03-31 DIAGNOSIS — G894 Chronic pain syndrome: Secondary | ICD-10-CM | POA: Diagnosis not present

## 2024-03-31 DIAGNOSIS — M961 Postlaminectomy syndrome, not elsewhere classified: Secondary | ICD-10-CM | POA: Diagnosis not present

## 2024-04-03 IMAGING — CT CT RENAL STONE PROTOCOL
2 of 4 series · 16 of 46 positions shown, 18 images · non-contrast
Comparison: None.

CLINICAL DATA: Right flank pain



[Series 2: axial st · axial · 0.83mm/px · z∈[+884,+1309]mm · 13 of 97 slices shown, 15 images]
[im 6/97  soft-tissue]
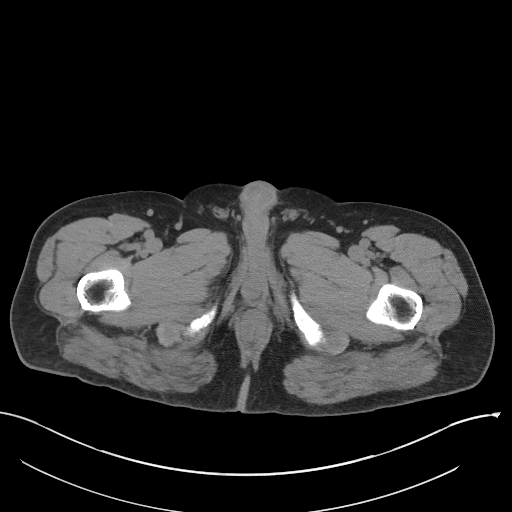
[im 6/97  bone]
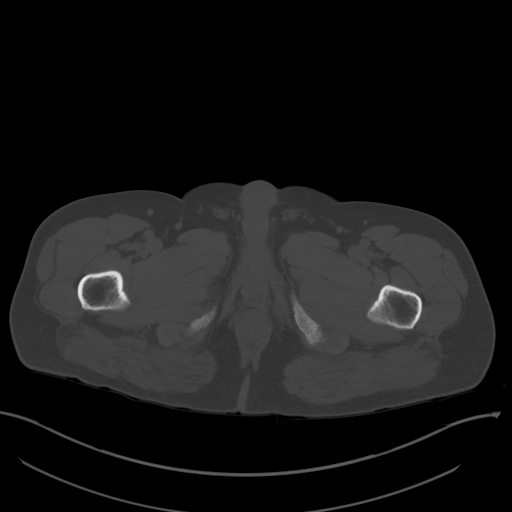
[im 11/97  soft-tissue]
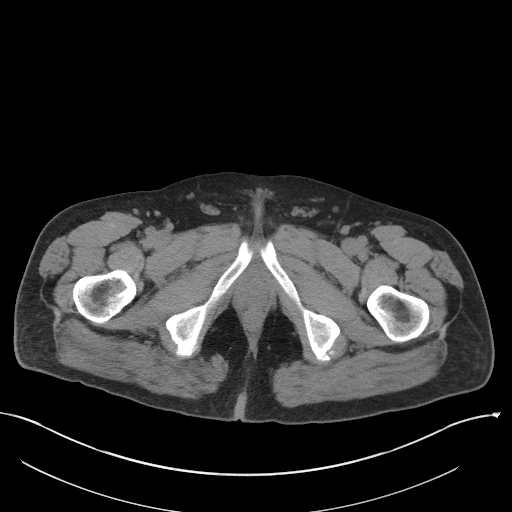
[im 22/97  soft-tissue]
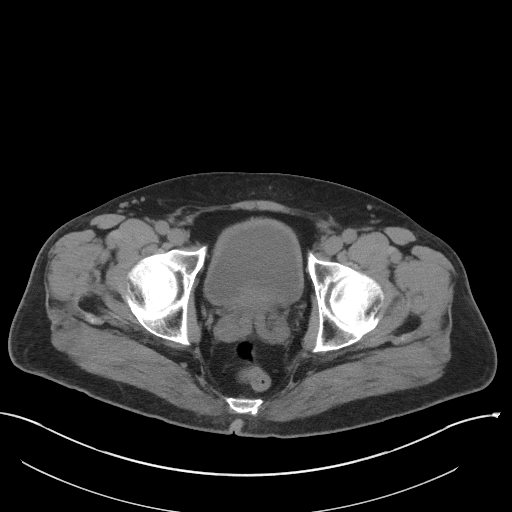
[im 27/97  soft-tissue]
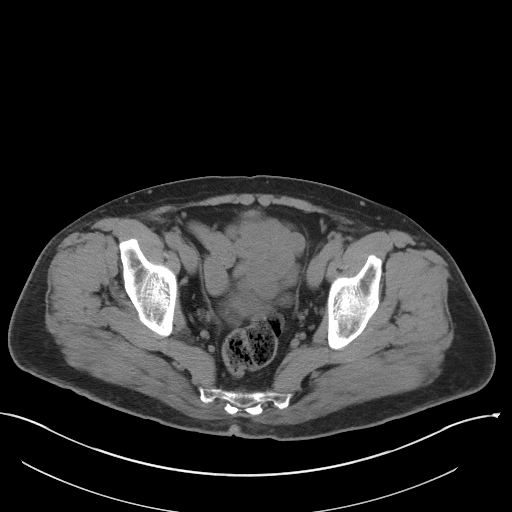
[im 33/97  soft-tissue]
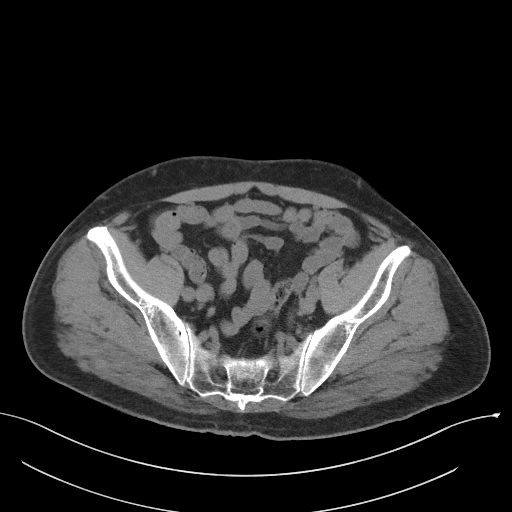
[im 43/97  soft-tissue]
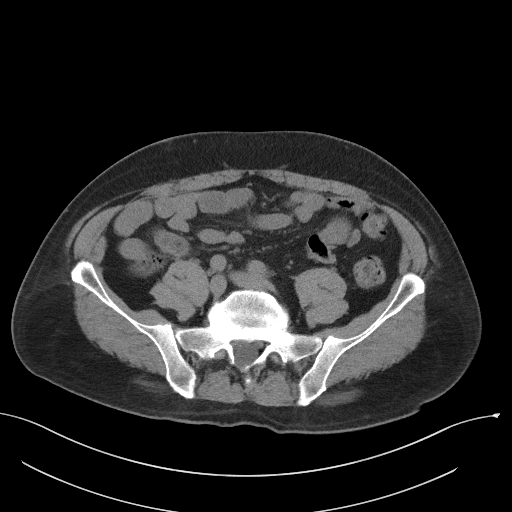
[im 49/97  soft-tissue]
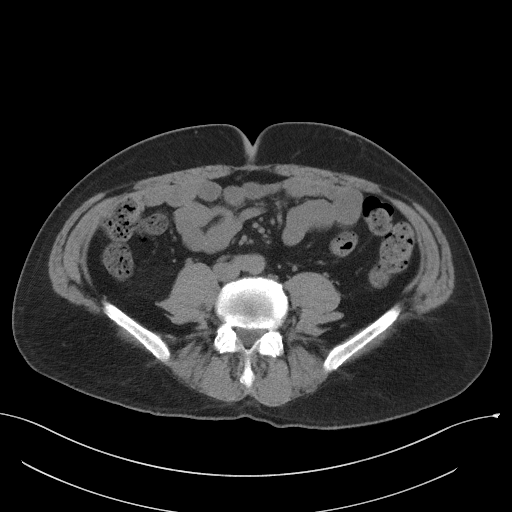
[im 54/97  soft-tissue]
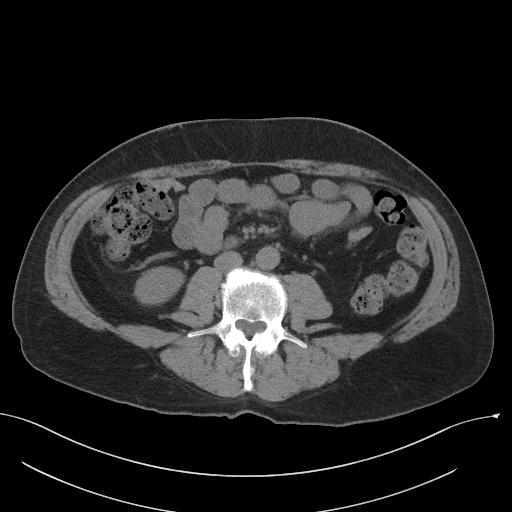
[im 65/97  soft-tissue]
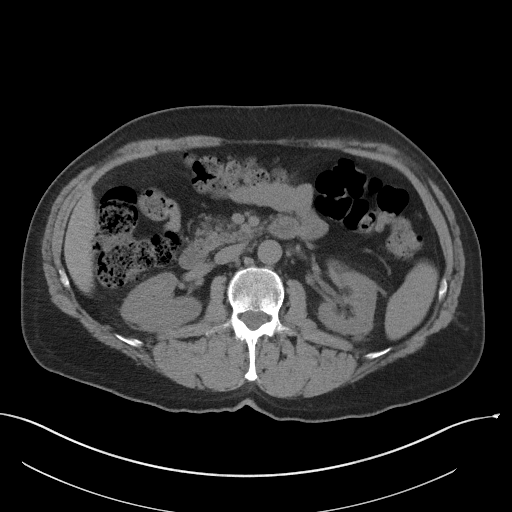
[im 65/97  bone]
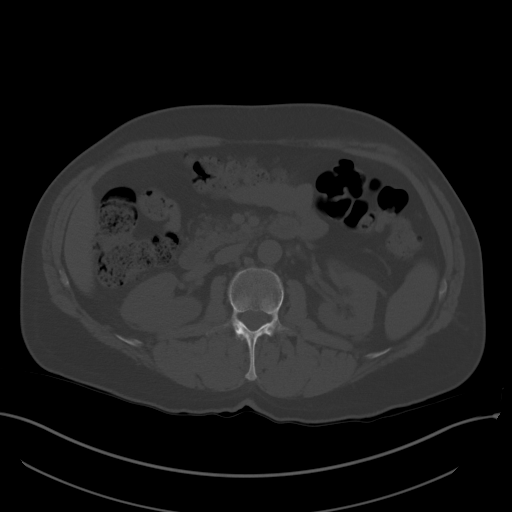
[im 70/97  soft-tissue]
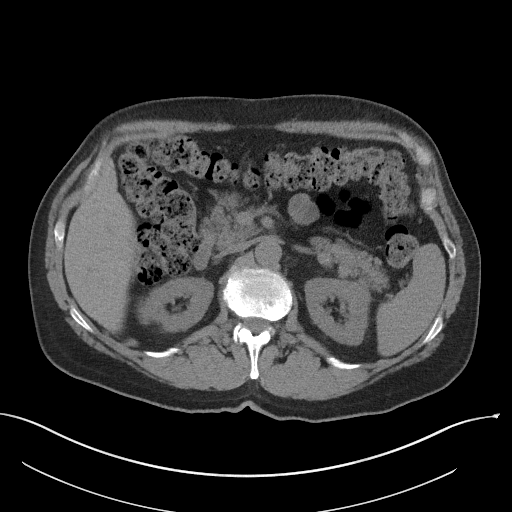
[im 75/97  soft-tissue]
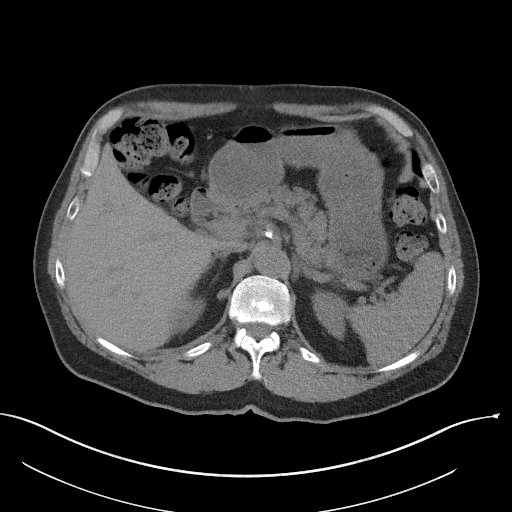
[im 86/97  soft-tissue]
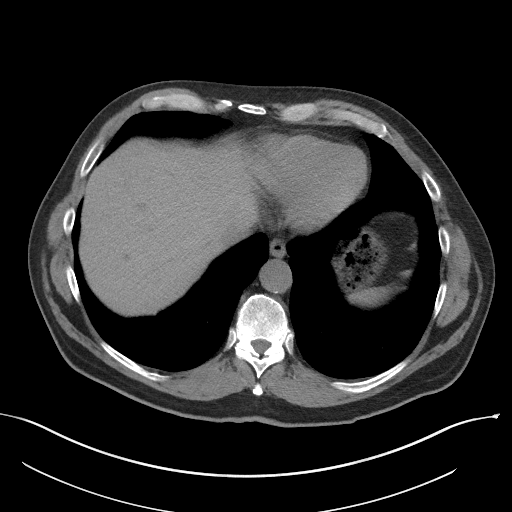
[im 91/97  soft-tissue]
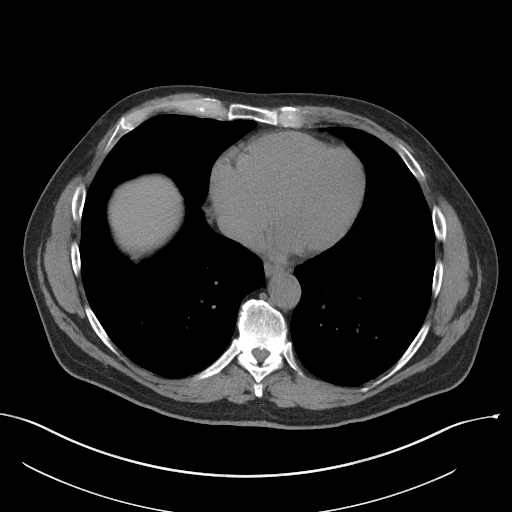

[Series 5: coronal st · coronal · 0.81mm/px · 3 of 107 slices shown]
[im 36/107  soft-tissue]
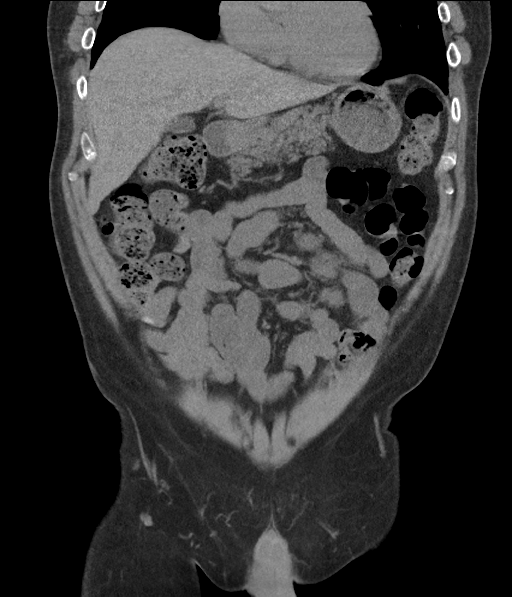
[im 48/107  soft-tissue]
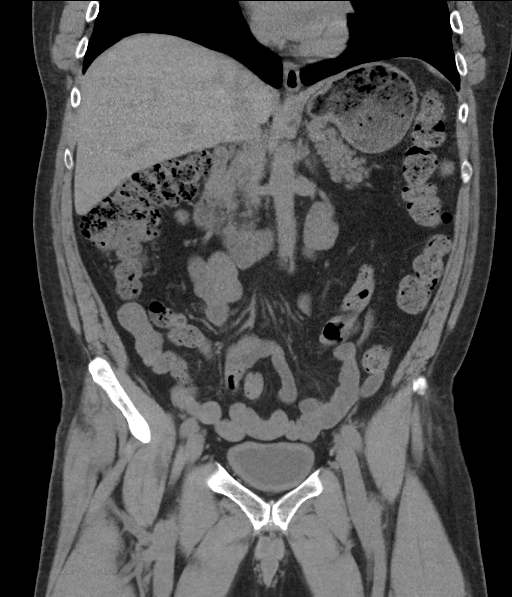
[im 59/107  soft-tissue]
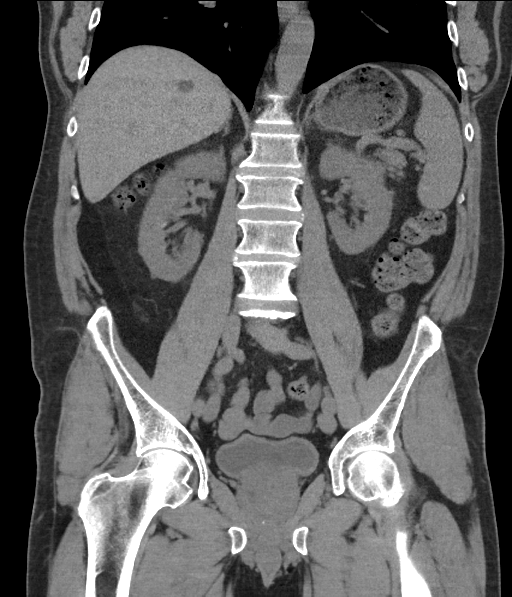

[16 of 46 positions shown; findings below may reference images not displayed]

FINDINGS: Lower chest: No acute abnormality.

Hepatobiliary: Scattered small low-attenuation lesions are seen in
the liver which are likely simple cysts. No suspicious liver
lesions. Gallbladder is unremarkable. No biliary ductal dilation.

Pancreas: Unremarkable. No pancreatic ductal dilatation or
surrounding inflammatory changes.

Spleen: Normal in size without focal abnormality.

Adrenals/Urinary Tract: Bilateral adrenal glands are unremarkable.
No hydronephrosis punctate nonobstructing stone of the lower pole
the left kidney. Wall thickening of the bladder.

Stomach/Bowel: Stomach is within normal limits. Appendix appears
normal. No evidence of bowel wall thickening, distention, or
inflammatory changes.

Vascular/Lymphatic: Minimal aortic atherosclerosis. No enlarged
abdominal or pelvic lymph nodes.

Reproductive: Prostatomegaly.

Other: No abdominal wall hernia or abnormality. No abdominopelvic
ascites.

Musculoskeletal: No acute or significant osseous findings.
IMPRESSION: 1. No acute findings in the abdomen or pelvis, including no evidence
of obstructive uropathy.
2. Punctate nonobstructing stone of the lower pole of the left
kidney.
3. Mild aortic Atherosclerosis ([id number]).

## 2024-05-03 NOTE — Progress Notes (Signed)
 Paul Pope, male    DOB: Dec 25, 1955    MRN: [id number]   Brief patient profile:  78  yowm  never regular smoker last exp to [employer] early 1980s  referred to pulmonary clinic in [place]  05/06/2024 by DR Shona  for asbestosis f/u    Pt not previously seen by [name].     History of Present Illness  05/06/2024  Pulmonary/ 1st Pope eval/ Paul Pope / Paul Pope on ACEi Chief Complaint  Patient presents with   Establish Care    Asbestosis exposure - hears noise In chest while breathing in and out   Dyspnea:  also limited MS / walks mailbox  Cough: hoarse  Sleep: 10 degrees hob/ 0-1 pillows  SABA use: none  02: none     No obvious day to day or daytime pattern/variability or assoc excess/ purulent sputum or mucus plugs or hemoptysis or cp or chest tightness, subjective wheeze or overt sinus or hb symptoms.    Also denies any obvious fluctuation of symptoms with weather or environmental changes or other aggravating or alleviating factors except as outlined above   No unusual exposure hx or h/o childhood pna/ asthma or knowledge of premature birth.  Current Allergies, Complete Past Medical History, Past Surgical History, Family History, and Social History were reviewed in Paul Pope.  ROS  The following are not active complaints unless bolded Hoarseness, sore throat, dysphagia, dental problems, itching, sneezing,  nasal congestion or discharge of excess mucus or purulent secretions, ear ache,   fever, chills, sweats, unintended wt loss or wt gain, classically pleuritic or exertional cp,  orthopnea pnd or arm/hand swelling  or leg swelling, presyncope, palpitations, abdominal pain, anorexia, nausea, vomiting, diarrhea  or change in bowel habits or change in bladder habits, change in stools or change in urine, dysuria, hematuria,  rash, arthralgias, visual complaints, headache, numbness, weakness or ataxia or problems with walking or coordination,   change in mood or  memory.            Outpatient Medications Prior to Visit  Medication Sig Dispense Refill   acidophilus (RISAQUAD) CAPS capsule Take 1 capsule by mouth daily.     alfuzosin  (UROXATRAL ) 10 MG 24 hr tablet Take 1 tablet (10 mg total) by mouth at bedtime. 30 tablet 11   aspirin EC 81 MG tablet Take 81 mg by mouth daily.     baclofen  (LIORESAL ) 20 MG tablet Take by mouth.     busPIRone (BUSPAR) 10 MG tablet Take by mouth.     cholecalciferol (VITAMIN D) 1000 units tablet Take 3,000 Units by mouth daily.     gabapentin  (NEURONTIN ) 800 MG tablet Take 1 tablet (800 mg total) by mouth 4 (four) times daily. 120 tablet 0   hydrOXYzine (ATARAX) 25 MG tablet Take 25 mg by mouth 3 (three) times daily as needed.     lisinopril (PRINIVIL,ZESTRIL) 10 MG tablet Take 1 tablet by mouth daily.     oxyCODONE -acetaminophen (PERCOCET) 10-325 MG tablet Take 4-5 tablets by mouth daily.     polyethylene glycol (MIRALAX / GLYCOLAX) packet Take 17 g by mouth daily as needed (constipation).     baclofen  (LIORESAL ) 10 MG tablet Take 1/2-1 tablet by mouth three times daily (Patient not taking: Reported on 05/06/2024) 90 each 3   donepezil (ARICEPT) 5 MG tablet Take by mouth. (Patient not taking: Reported on 05/06/2024)     etodolac  (LODINE ) 400 MG tablet TAKE (1) TABLET TWICE DAILY. (Patient  not taking: Reported on 05/06/2024) 60 tablet 1   PARoxetine (PAXIL-CR) 25 MG 24 hr tablet Take 25 mg by mouth daily. (Patient not taking: Reported on 05/06/2024)     No facility-administered medications prior to visit.    Past Medical History:  Diagnosis Date   Hypertension    Kidney stones    Migraine    Multiple sclerosis (HCC)    Vision abnormalities       Objective:     BP (!) 145/75   Pulse 75   Ht 5' 11 (1.803 m)   Wt 193 lb (87.5 kg)   SpO2 98% Comment: ra  BMI 26.92 kg/m   SpO2: 98 % (ra)  amb slt hoarse wm nad    HEENT : Oropharynx  clear      Nasal turbinates nl    NECK :  without   apparent JVD/ palpable Nodes/TM    LUNGS: no acc muscle use,  Nl contour chest which is clear to A and P bilaterally without cough on insp or exp maneuvers   CV:  RRR  no s3 or murmur or increase in P2, and no edema   ABD:  soft and nontender   MS:  Gait nl   ext warm without deformities Or obvious joint restrictions  calf tenderness, cyanosis or clubbing    SKIN: warm and dry without lesions    NEURO:  alert, approp, nl sensorium with  no motor or cerebellar deficits apparent.         CXR PA and Lateral:   05/06/2024 :    I personally reviewed images and impression is as follows:     Wnl / no evidence of astbestosis or pleural dz   Assessment   Assessment & Plan Asbestosis [facility]) Never smoker Last exp was 1980s  - 05/06/2024   Walked on RA  x  3  lap(s) =  approx 450  ft  @ mod pace, stopped due to end of study  with lowest 02 sats 94% and no sob   >>> Yearly CT and pfts per contract with [facility] [employer]  ordered as per letter of his lawyer though I did tell him the decision is his to make      Essential hypertension Changed ACEi to ARB 05/06/2024 hoarseness / dysphagia   >>> rx valsartan 160 mg one - half to one daily plans to self monitor and f/u with PCP   Each maintenance medication was reviewed in detail including emphasizing most importantly the difference between maintenance and prns and under what circumstances the prns are to be triggered using an action plan format where appropriate.  Total time for H and P, chart review, counseling,  directly observing portions of ambulatory 02 saturation study/ and generating customized AVS unique to this Pope visit / same day charting = 60 min new pt eval                   AVS  Patient Instructions  Stop lisinopril and start valsartan 160 mg one half or one daily   My Pope will be contacting you by phone for referral to [facility] for CT and pfts  - if you don't hear back from my Pope within one week please call us  back or  notify us  thru MyChart and we'll address it right away.    Please schedule a follow up visit in12  months but call sooner if needed        Ozell America, MD 05/06/2024

## 2024-05-06 ENCOUNTER — Ambulatory Visit (INDEPENDENT_AMBULATORY_CARE_PROVIDER_SITE_OTHER): Payer: PRIVATE HEALTH INSURANCE | Admitting: Internal Medicine

## 2024-05-06 ENCOUNTER — Ambulatory Visit (HOSPITAL_COMMUNITY)
Admission: RE | Admit: 2024-05-06 | Discharge: 2024-05-06 | Disposition: A | Discharge status: Home / Self Care | Payer: Self-pay | Source: Ambulatory Visit | Attending: Internal Medicine | Admitting: Internal Medicine

## 2024-05-06 ENCOUNTER — Encounter: Payer: Self-pay | Admitting: Internal Medicine

## 2024-05-06 VITALS — BP 145/75 | HR 75 | Ht 71.0 in | Wt 193.0 lb

## 2024-05-06 DIAGNOSIS — Z77090 Contact with and (suspected) exposure to asbestos: Secondary | ICD-10-CM | POA: Diagnosis not present

## 2024-05-06 DIAGNOSIS — J40 Bronchitis, not specified as acute or chronic: Secondary | ICD-10-CM | POA: Diagnosis not present

## 2024-05-06 DIAGNOSIS — I10 Essential (primary) hypertension: Secondary | ICD-10-CM | POA: Diagnosis not present

## 2024-05-06 DIAGNOSIS — J61 Pneumoconiosis due to asbestos and other mineral fibers: Secondary | ICD-10-CM | POA: Insufficient documentation

## 2024-05-06 NOTE — Assessment & Plan Note (Addendum)
 Never smoker Last exp was 1980s  - 05/06/2024   Walked on RA  x  3  lap(s) =  approx 450  ft  @ mod pace, stopped due to end of study  with lowest 02 sats 94% and no sob   >>> Yearly CT and pfts per contract with [facility] [employer]  ordered as per letter of his lawyer though I did tell him the decision is his to make

## 2024-05-06 NOTE — Assessment & Plan Note (Addendum)
 Changed ACEi to ARB 05/06/2024 hoarseness / dysphagia   >>> rx valsartan 160 mg one - half to one daily plans to self monitor and f/u with PCP   Each maintenance medication was reviewed in detail including emphasizing most importantly the difference between maintenance and prns and under what circumstances the prns are to be triggered using an action plan format where appropriate.  Total time for H and P, chart review, counseling,  directly observing portions of ambulatory 02 saturation study/ and generating customized AVS unique to this office visit / same day charting = 60 min new pt eval

## 2024-05-06 NOTE — Patient Instructions (Addendum)
 Stop lisinopril and start valsartan 160 mg one half or one daily   Please remember to go to the  x-ray department  @  [facility] for your tests - we will call you with the results when they are available      Please schedule a follow up visit in 12  months but call sooner if needed

## 2024-05-07 ENCOUNTER — Ambulatory Visit: Payer: Self-pay | Admitting: Internal Medicine

## 2024-05-08 DIAGNOSIS — R7301 Impaired fasting glucose: Secondary | ICD-10-CM | POA: Diagnosis not present

## 2024-05-08 DIAGNOSIS — I10 Essential (primary) hypertension: Secondary | ICD-10-CM | POA: Diagnosis not present

## 2024-05-08 DIAGNOSIS — E782 Mixed hyperlipidemia: Secondary | ICD-10-CM | POA: Diagnosis not present

## 2024-05-08 NOTE — Progress Notes (Signed)
 Called and spoke with pt regarding results pt confirmed understanding. nfn

## 2024-05-12 DIAGNOSIS — G894 Chronic pain syndrome: Secondary | ICD-10-CM | POA: Diagnosis not present

## 2024-05-12 DIAGNOSIS — G35 Multiple sclerosis: Secondary | ICD-10-CM | POA: Diagnosis not present

## 2024-05-12 DIAGNOSIS — I10 Essential (primary) hypertension: Secondary | ICD-10-CM | POA: Diagnosis not present

## 2024-05-12 DIAGNOSIS — E663 Overweight: Secondary | ICD-10-CM | POA: Diagnosis not present

## 2024-05-12 DIAGNOSIS — G3184 Mild cognitive impairment, so stated: Secondary | ICD-10-CM | POA: Diagnosis not present

## 2024-05-12 DIAGNOSIS — F339 Major depressive disorder, recurrent, unspecified: Secondary | ICD-10-CM | POA: Diagnosis not present

## 2024-05-12 DIAGNOSIS — F419 Anxiety disorder, unspecified: Secondary | ICD-10-CM | POA: Diagnosis not present

## 2024-05-12 DIAGNOSIS — L409 Psoriasis, unspecified: Secondary | ICD-10-CM | POA: Diagnosis not present

## 2024-05-12 DIAGNOSIS — E782 Mixed hyperlipidemia: Secondary | ICD-10-CM | POA: Diagnosis not present

## 2024-05-26 DIAGNOSIS — M47816 Spondylosis without myelopathy or radiculopathy, lumbar region: Secondary | ICD-10-CM | POA: Diagnosis not present

## 2024-05-26 DIAGNOSIS — G894 Chronic pain syndrome: Secondary | ICD-10-CM | POA: Diagnosis not present

## 2024-05-26 DIAGNOSIS — Z79891 Long term (current) use of opiate analgesic: Secondary | ICD-10-CM | POA: Diagnosis not present

## 2024-05-26 DIAGNOSIS — M961 Postlaminectomy syndrome, not elsewhere classified: Secondary | ICD-10-CM | POA: Diagnosis not present

## 2024-06-15 DIAGNOSIS — F5101 Primary insomnia: Secondary | ICD-10-CM | POA: Diagnosis not present

## 2024-06-15 DIAGNOSIS — R413 Other amnesia: Secondary | ICD-10-CM | POA: Diagnosis not present

## 2024-06-15 DIAGNOSIS — G35D Multiple sclerosis, unspecified: Secondary | ICD-10-CM | POA: Diagnosis not present

## 2024-06-15 DIAGNOSIS — R4189 Other symptoms and signs involving cognitive functions and awareness: Secondary | ICD-10-CM | POA: Diagnosis not present

## 2024-06-24 DIAGNOSIS — R413 Other amnesia: Secondary | ICD-10-CM | POA: Diagnosis not present

## 2024-06-24 DIAGNOSIS — G35D Multiple sclerosis, unspecified: Secondary | ICD-10-CM | POA: Diagnosis not present

## 2024-07-21 DIAGNOSIS — M961 Postlaminectomy syndrome, not elsewhere classified: Secondary | ICD-10-CM | POA: Diagnosis not present

## 2024-07-21 DIAGNOSIS — G894 Chronic pain syndrome: Secondary | ICD-10-CM | POA: Diagnosis not present

## 2024-07-21 DIAGNOSIS — Z79891 Long term (current) use of opiate analgesic: Secondary | ICD-10-CM | POA: Diagnosis not present

## 2024-07-21 DIAGNOSIS — M47816 Spondylosis without myelopathy or radiculopathy, lumbar region: Secondary | ICD-10-CM | POA: Diagnosis not present
